# Patient Record
Sex: Female | Born: 1970 | Race: White | Hispanic: No | Marital: Married | Smoking: Never smoker
Health system: Southern US, Community
[De-identification: ages and names within clinical notes are randomized; demographics above are authoritative.]

## PROBLEM LIST (undated history)

## (undated) DIAGNOSIS — N289 Disorder of kidney and ureter, unspecified: Secondary | ICD-10-CM

## (undated) DIAGNOSIS — K279 Peptic ulcer, site unspecified, unspecified as acute or chronic, without hemorrhage or perforation: Secondary | ICD-10-CM

## (undated) DIAGNOSIS — R519 Headache, unspecified: Secondary | ICD-10-CM

## (undated) DIAGNOSIS — K219 Gastro-esophageal reflux disease without esophagitis: Secondary | ICD-10-CM

## (undated) DIAGNOSIS — J329 Chronic sinusitis, unspecified: Secondary | ICD-10-CM

## (undated) DIAGNOSIS — H9319 Tinnitus, unspecified ear: Secondary | ICD-10-CM

## (undated) DIAGNOSIS — R112 Nausea with vomiting, unspecified: Secondary | ICD-10-CM

## (undated) DIAGNOSIS — Z87442 Personal history of urinary calculi: Secondary | ICD-10-CM

## (undated) DIAGNOSIS — Z9889 Other specified postprocedural states: Secondary | ICD-10-CM

## (undated) DIAGNOSIS — N83209 Unspecified ovarian cyst, unspecified side: Secondary | ICD-10-CM

## (undated) DIAGNOSIS — R51 Headache: Secondary | ICD-10-CM

## (undated) DIAGNOSIS — R42 Dizziness and giddiness: Secondary | ICD-10-CM

## (undated) HISTORY — DX: Gastro-esophageal reflux disease without esophagitis: K21.9

## (undated) HISTORY — DX: Dizziness and giddiness: R42

## (undated) HISTORY — PX: KIDNEY STONE SURGERY: SHX686

## (undated) HISTORY — DX: Unspecified ovarian cyst, unspecified side: N83.209

## (undated) HISTORY — PX: APPENDECTOMY: SHX54

## (undated) HISTORY — DX: Tinnitus, unspecified ear: H93.19

---

## 2013-06-29 ENCOUNTER — Emergency Department (HOSPITAL_COMMUNITY): Payer: Self-pay

## 2013-06-29 ENCOUNTER — Encounter (HOSPITAL_COMMUNITY): Payer: Self-pay

## 2013-06-29 ENCOUNTER — Emergency Department (HOSPITAL_COMMUNITY)
Admission: EM | Admit: 2013-06-29 | Discharge: 2013-06-29 | Disposition: A | Discharge status: Home / Self Care | Payer: Self-pay | Attending: Emergency Medicine | Admitting: Emergency Medicine

## 2013-06-29 DIAGNOSIS — Z79899 Other long term (current) drug therapy: Secondary | ICD-10-CM | POA: Insufficient documentation

## 2013-06-29 DIAGNOSIS — Z3202 Encounter for pregnancy test, result negative: Secondary | ICD-10-CM | POA: Insufficient documentation

## 2013-06-29 DIAGNOSIS — N200 Calculus of kidney: Secondary | ICD-10-CM | POA: Insufficient documentation

## 2013-06-29 LAB — URINE MICROSCOPIC-ADD ON

## 2013-06-29 LAB — URINALYSIS, ROUTINE W REFLEX MICROSCOPIC
Nitrite: NEGATIVE
Specific Gravity, Urine: >1.03 — ABNORMAL HIGH (ref 1.005–1.030)
Urobilinogen, UA: 0.2 mg/dL (ref 0.0–1.0)
pH: 5.5 (ref 5.0–8.0)

## 2013-06-29 LAB — POCT PREGNANCY, URINE: Preg Test, Ur: NEGATIVE

## 2013-06-29 MED ORDER — OXYCODONE-ACETAMINOPHEN 5-325 MG PO TABS: 2.0000 | ORAL_TABLET | ORAL | Status: DC | PRN

## 2013-06-29 MED ORDER — ONDANSETRON HCL 4 MG/2ML IJ SOLN
4.0000 mg | Freq: Once | INTRAMUSCULAR | Status: AC
  Administered 2013-06-29: 4 mg via INTRAVENOUS
  Filled 2013-06-29: qty 2

## 2013-06-29 MED ORDER — HYDROMORPHONE HCL PF 1 MG/ML IJ SOLN
INTRAMUSCULAR | Status: AC
  Administered 2013-06-29: 1 mg via INTRAVENOUS
  Filled 2013-06-29: qty 1

## 2013-06-29 MED ORDER — KETOROLAC TROMETHAMINE 30 MG/ML IJ SOLN
INTRAMUSCULAR | Status: AC
  Administered 2013-06-29: 30 mg via INTRAVENOUS
  Filled 2013-06-29: qty 1

## 2013-06-29 MED ORDER — PROMETHAZINE HCL 25 MG PO TABS: 25.0000 mg | ORAL_TABLET | Freq: Four times a day (QID) | ORAL | Status: DC | PRN

## 2013-06-29 NOTE — ED Provider Notes (Signed)
CSN: [id number]     Arrival date & time 06/29/13  7829 History   First MD Initiated Contact with Patient 06/29/13 0340     Chief Complaint  Patient presents with  . Abdominal Pain   (Consider location/radiation/quality/duration/timing/severity/associated sxs/prior Treatment) HPI.... abrupt onset of sharp left flank pain radiating to left lower quadrant approximately 10 PM last night. Urinating in small amounts. No hematuria, fever, chills, dysuria. No previous history of kidney stones. Nothing makes symptoms better or worse. Severity is moderate to severe.  History reviewed. No pertinent past medical history. Past Surgical History  Procedure Laterality Date  . Appendectomy     No family history on file. History  Substance Use Topics  . Smoking status: Never Smoker   . Smokeless tobacco: Not on file  . Alcohol Use: Not on file   OB History   Grav Para Term Preterm Abortions TAB SAB Ect Mult Living                 Review of Systems  All other systems reviewed and are negative.    Allergies  Review of patient's allergies indicates no known allergies.  Home Medications   Current Outpatient Rx  Name  Route  Sig  Dispense  Refill  . cetirizine (ZYRTEC) 10 MG tablet   Oral   Take 10 mg by mouth daily.         Marland Kitchen oxyCODONE-acetaminophen (PERCOCET) 5-325 MG per tablet   Oral   Take 2 tablets by mouth every 4 (four) hours as needed for pain.   6 tablet   0   . oxyCODONE-acetaminophen (PERCOCET) 5-325 MG per tablet   Oral   Take 2 tablets by mouth every 4 (four) hours as needed for pain.   15 tablet   0   . promethazine (PHENERGAN) 25 MG tablet   Oral   Take 1 tablet (25 mg total) by mouth every 6 (six) hours as needed for nausea.   15 tablet   0    BP 145/79  Pulse 99  Temp(Src) 98.4 F (36.9 C)  Resp 24  Ht 5\' 7"  (1.702 m)  Wt 150 lb (68.04 kg)  BMI 23.49 kg/m2  SpO2 98%  LMP 06/06/2013 Physical Exam  Nursing note and vitals reviewed. Constitutional:  She is oriented to person, place, and time. She appears well-developed and well-nourished.  HENT:  Head: Normocephalic and atraumatic.  Eyes: Conjunctivae and EOM are normal. Pupils are equal, round, and reactive to light.  Neck: Normal range of motion. Neck supple.  Cardiovascular: Normal rate, regular rhythm and normal heart sounds.   Pulmonary/Chest: Effort normal and breath sounds normal.  Abdominal: Soft. Bowel sounds are normal.  Genitourinary:  Minimal left flank tenderness radiating to left lower quadrant  Musculoskeletal: Normal range of motion.  Neurological: She is alert and oriented to person, place, and time.  Skin: Skin is warm and dry.  Psychiatric: She has a normal mood and affect.    ED Course  Procedures (including critical care time) Labs Review Labs Reviewed  URINALYSIS, ROUTINE W REFLEX MICROSCOPIC - Abnormal; Notable for the following:    APPearance CLOUDY (*)    Specific Gravity, Urine >1.030 (*)    Hgb urine dipstick LARGE (*)    Bilirubin Urine SMALL (*)    Protein, ur TRACE (*)    Leukocytes, UA SMALL (*)    All other components within normal limits  URINE MICROSCOPIC-ADD ON - Abnormal; Notable for the following:    Squamous  Epithelial / LPF FEW (*)    All other components within normal limits  URINE CULTURE   Imaging Review Ct Abdomen Pelvis Wo Contrast  06/29/2013   *RADIOLOGY REPORT*  Clinical Data: Left flank and left lower quadrant pain since 10:00 p.m.  CT ABDOMEN AND PELVIS WITHOUT CONTRAST  Technique:  Multidetector CT imaging of the abdomen and pelvis was performed following the standard protocol without intravenous contrast.  Comparison: None.  Findings: Mild dependent changes in the lung bases.  Small esophageal hiatal hernia.  Multiple intrarenal stones in both kidneys.  There is a 3 mm stone in the distal left ureter at the ureterovesicle junction with moderate proximal ureterectasis and pyelocaliectasis.  No significant pararenal stranding.   No ureteral stone or dilatation on the right.  The bladder is decompressed.  The unenhanced appearance of the liver, spleen, gallbladder, pancreas, adrenal glands, abdominal aorta, inferior vena cava, and retroperitoneal lymph nodes is unremarkable.  The stomach and small bowel are decompressed.  Stool filled colon without distension.  No free air or free fluid in the abdomen.  Pelvis:  Uterus and ovaries are not significantly enlarged.  Low attenuation in the left ovary likely represents a functional cyst. No free or loculated pelvic fluid collections.  The appendix is normal.  No evidence of diverticulitis.  No significant pelvic lymphadenopathy.  Normal alignment of the lumbar spine.  IMPRESSION: 3 mm stone in the distal left ureter with moderate proximal obstruction.  Nonobstructing bilateral intrarenal stones.   Original Report Authenticated By: Burman Nieves, M.D.    MDM   1. Kidney stone on left side    History and physical consistent with kidney stone. CT scan reveals 3 mm distal left ureteral stone.  Patient feels better after pain management.   Discharge meds Percocet and Phenergan 25 mg    Donnetta Hutching, MD 06/29/13 [phone]

## 2013-06-30 LAB — URINE CULTURE

## 2013-07-16 MED FILL — Oxycodone w/ Acetaminophen Tab 5-325 MG: ORAL | Qty: 6 | Status: AC

## 2015-04-11 ENCOUNTER — Other Ambulatory Visit (HOSPITAL_COMMUNITY): Payer: Self-pay | Admitting: Internal Medicine

## 2015-04-11 DIAGNOSIS — Z1231 Encounter for screening mammogram for malignant neoplasm of breast: Secondary | ICD-10-CM

## 2015-04-19 ENCOUNTER — Ambulatory Visit (HOSPITAL_COMMUNITY): Payer: Self-pay

## 2015-10-22 HISTORY — PX: OTHER SURGICAL HISTORY: SHX169

## 2015-12-21 ENCOUNTER — Encounter (HOSPITAL_COMMUNITY): Payer: Self-pay | Admitting: *Deleted

## 2015-12-21 ENCOUNTER — Emergency Department (HOSPITAL_COMMUNITY): Payer: BLUE CROSS/BLUE SHIELD

## 2015-12-21 ENCOUNTER — Emergency Department (HOSPITAL_COMMUNITY)
Admission: EM | Admit: 2015-12-21 | Discharge: 2015-12-21 | Disposition: A | Discharge status: Home / Self Care | Payer: BLUE CROSS/BLUE SHIELD | Attending: Emergency Medicine | Admitting: Emergency Medicine

## 2015-12-21 DIAGNOSIS — R69 Illness, unspecified: Secondary | ICD-10-CM

## 2015-12-21 DIAGNOSIS — R112 Nausea with vomiting, unspecified: Secondary | ICD-10-CM | POA: Diagnosis not present

## 2015-12-21 DIAGNOSIS — Z79899 Other long term (current) drug therapy: Secondary | ICD-10-CM | POA: Diagnosis not present

## 2015-12-21 DIAGNOSIS — Z8711 Personal history of peptic ulcer disease: Secondary | ICD-10-CM | POA: Insufficient documentation

## 2015-12-21 DIAGNOSIS — R0789 Other chest pain: Secondary | ICD-10-CM | POA: Insufficient documentation

## 2015-12-21 DIAGNOSIS — J111 Influenza due to unidentified influenza virus with other respiratory manifestations: Secondary | ICD-10-CM | POA: Diagnosis not present

## 2015-12-21 DIAGNOSIS — R079 Chest pain, unspecified: Secondary | ICD-10-CM | POA: Diagnosis present

## 2015-12-21 HISTORY — DX: Chronic sinusitis, unspecified: J32.9

## 2015-12-21 HISTORY — DX: Peptic ulcer, site unspecified, unspecified as acute or chronic, without hemorrhage or perforation: K27.9

## 2015-12-21 LAB — BASIC METABOLIC PANEL
Anion gap: 8 (ref 5–15)
BUN: 12 mg/dL (ref 6–20)
CHLORIDE: 108 mmol/L (ref 101–111)
CO2: 26 mmol/L (ref 22–32)
CREATININE: 0.66 mg/dL (ref 0.44–1.00)
Calcium: 8.5 mg/dL — ABNORMAL LOW (ref 8.9–10.3)
GFR calc non Af Amer: >60 mL/min (ref >60)
GLUCOSE: 100 mg/dL — AB (ref 65–99)
Potassium: 3.8 mmol/L (ref 3.5–5.1)
Sodium: 142 mmol/L (ref 135–145)

## 2015-12-21 LAB — CBC WITH DIFFERENTIAL/PLATELET
Basophils Absolute: 0 10*3/uL (ref 0.0–0.1)
Basophils Relative: 1 %
Eosinophils Absolute: 0.1 10*3/uL (ref 0.0–0.7)
Eosinophils Relative: 1 %
HEMATOCRIT: 40.5 % (ref 36.0–46.0)
HEMOGLOBIN: 14 g/dL (ref 12.0–15.0)
LYMPHS ABS: 1.1 10*3/uL (ref 0.7–4.0)
LYMPHS PCT: 24 %
MCH: 31.3 pg (ref 26.0–34.0)
MCHC: 34.6 g/dL (ref 30.0–36.0)
MCV: 90.6 fL (ref 78.0–100.0)
MONOS PCT: 6 %
Monocytes Absolute: 0.3 10*3/uL (ref 0.1–1.0)
NEUTROS ABS: 3.1 10*3/uL (ref 1.7–7.7)
NEUTROS PCT: 68 %
Platelets: 288 10*3/uL (ref 150–400)
RBC: 4.47 MIL/uL (ref 3.87–5.11)
RDW: 12 % (ref 11.5–15.5)
WBC: 4.5 10*3/uL (ref 4.0–10.5)

## 2015-12-21 LAB — I-STAT TROPONIN, ED
Troponin i, poc: 0 ng/mL (ref 0.00–0.08)
Troponin i, poc: 0 ng/mL (ref 0.00–0.08)

## 2015-12-21 MED ORDER — ALBUTEROL SULFATE (2.5 MG/3ML) 0.083% IN NEBU
5.0000 mg | INHALATION_SOLUTION | Freq: Once | RESPIRATORY_TRACT | Status: AC
  Administered 2015-12-21: 5 mg via RESPIRATORY_TRACT
  Filled 2015-12-21: qty 6

## 2015-12-21 MED ORDER — PROMETHAZINE HCL 25 MG/ML IJ SOLN
25.0000 mg | Freq: Once | INTRAMUSCULAR | Status: AC
  Administered 2015-12-21: 25 mg via INTRAMUSCULAR
  Filled 2015-12-21: qty 1

## 2015-12-21 MED ORDER — OXYCODONE-ACETAMINOPHEN 5-325 MG PO TABS: 1.0000 | ORAL_TABLET | ORAL | Status: DC | PRN

## 2015-12-21 MED ORDER — METOCLOPRAMIDE HCL 5 MG/ML IJ SOLN
10.0000 mg | Freq: Once | INTRAMUSCULAR | Status: DC
  Filled 2015-12-21: qty 2

## 2015-12-21 MED ORDER — OXYCODONE-ACETAMINOPHEN 5-325 MG PO TABS
2.0000 | ORAL_TABLET | Freq: Once | ORAL | Status: AC
  Administered 2015-12-21: 2 via ORAL
  Filled 2015-12-21: qty 2

## 2015-12-21 MED ORDER — PROMETHAZINE HCL 25 MG RE SUPP: 25.0000 mg | Freq: Four times a day (QID) | RECTAL | Status: DC | PRN

## 2015-12-21 MED ORDER — PANTOPRAZOLE SODIUM 40 MG PO TBEC
40.0000 mg | DELAYED_RELEASE_TABLET | Freq: Once | ORAL | Status: AC
  Administered 2015-12-21: 40 mg via ORAL
  Filled 2015-12-21: qty 1

## 2015-12-21 MED ORDER — GI COCKTAIL ~~LOC~~
30.0000 mL | Freq: Once | ORAL | Status: AC
  Administered 2015-12-21: 30 mL via ORAL
  Filled 2015-12-21: qty 30

## 2015-12-21 MED ORDER — METOCLOPRAMIDE HCL 10 MG PO TABS
10.0000 mg | ORAL_TABLET | Freq: Once | ORAL | Status: AC
  Administered 2015-12-21: 10 mg via ORAL
  Filled 2015-12-21: qty 1

## 2015-12-21 MED ORDER — MORPHINE SULFATE (PF) 4 MG/ML IV SOLN
4.0000 mg | Freq: Once | INTRAVENOUS | Status: AC
  Administered 2015-12-21: 4 mg via INTRAMUSCULAR
  Filled 2015-12-21: qty 1

## 2015-12-21 NOTE — ED Provider Notes (Signed)
CSN: [id number]     Arrival date & time 12/21/15  4098 History   First MD Initiated Contact with Patient 12/21/15 [phone]     Chief Complaint  Patient presents with  . Chest Pain     (Consider location/radiation/quality/duration/timing/severity/associated sxs/prior Treatment) HPI this 45 year old female who presents today from her primary care office with multiple complaints. She states that she initially began having some influenza-like symptoms 2 days ago. She noted some nasal congestion, cough, chills and had a fever at home. She has also had some nausea and vomiting associated with this. She states that yesterday she had 2 episodes where the emesis had blood in it. She has a history of peptic ulcer disease and has had some bleeding with that in the past. The vomiting has been somewhat ongoing although it worsened during her illness. She has not noted any ongoing hematemesis, coffee-ground emesis, any blood in her stool. She has not been lightheaded. She has also begun having some pain in the left anterior side of her chest that she describes as pressure and sharp. Is worse with movement and coughing. Pain is intermittent. It lasts up to 15-30 minutes and she took aspirin last night for. She is currently not the pain except with palpation or movement or coughing.  Past Medical History  Diagnosis Date  . Sinusitis   . Peptic ulcer    Past Surgical History  Procedure Laterality Date  . Appendectomy     No family history on file. Social History  Substance Use Topics  . Smoking status: Never Smoker   . Smokeless tobacco: None  . Alcohol Use: Yes     Comment: Occ   OB History    No data available     Review of Systems  All other systems reviewed and are negative.     Allergies  Review of patient's allergies indicates no known allergies.  Home Medications   Prior to Admission medications   Medication Sig Start Date End Date Taking? Authorizing Provider  cetirizine (ZYRTEC) 10 MG  tablet Take 10 mg by mouth daily.    Historical Provider, MD  oxyCODONE-acetaminophen (PERCOCET) 5-325 MG per tablet Take 2 tablets by mouth every 4 (four) hours as needed for pain. 06/29/13   Donnetta Hutching, MD  oxyCODONE-acetaminophen (PERCOCET) 5-325 MG per tablet Take 2 tablets by mouth every 4 (four) hours as needed for pain. 06/29/13   Donnetta Hutching, MD  promethazine (PHENERGAN) 25 MG tablet Take 1 tablet (25 mg total) by mouth every 6 (six) hours as needed for nausea. 06/29/13   Donnetta Hutching, MD   BP 156/95 mmHg  Pulse 83  Temp(Src) 97.9 F (36.6 C) (Oral)  Resp 14  Ht  (1.702 m)  Wt 63.504 kg  BMI 21.92 kg/m2  SpO2 100%  LMP 12/17/2015 Physical Exam  Constitutional: She is oriented to person, place, and time. She appears well-developed and well-nourished. No distress.  HENT:  Head: Normocephalic and atraumatic.  Right Ear: External ear normal.  Left Ear: External ear normal.  Nose: Nose normal.  Eyes: Conjunctivae and EOM are normal. Pupils are equal, round, and reactive to light.  Neck: Normal range of motion. Neck supple.  Cardiovascular: Normal rate, regular rhythm, normal heart sounds and intact distal pulses.   Pulmonary/Chest: Effort normal and breath sounds normal. She exhibits tenderness.    Musculoskeletal: Normal range of motion. She exhibits no edema or tenderness.  Neurological: She is alert and oriented to person, place, and time. She exhibits normal  muscle tone. Coordination normal.  Skin: Skin is warm and dry.  Psychiatric: She has a normal mood and affect. Her behavior is normal. Judgment and thought content normal.  Nursing note and vitals reviewed.   ED Course  Procedures (including critical care time) Labs Review Labs Reviewed  BASIC METABOLIC PANEL - Abnormal; Notable for the following:    Glucose, Bld 100 (*)    Calcium 8.5 (*)    All other components within normal limits  CBC WITH DIFFERENTIAL/PLATELET  Rosezena Sensor, ED  POC OCCULT BLOOD, ED    Rosezena Sensor, ED    Imaging Review Dg Chest 2 View  12/21/2015  CLINICAL DATA:  Chest pain for several weeks. EXAM: CHEST  2 VIEW COMPARISON:  None. FINDINGS: The heart size and mediastinal contours are within normal limits. Both lungs are clear. No pneumothorax or pleural effusion is noted. The visualized skeletal structures are unremarkable. IMPRESSION: No active cardiopulmonary disease. Electronically Signed   By: Lupita Raider, M.D.   On: 12/21/2015 08:46   US Abdomen Complete  12/21/2015  CLINICAL DATA:  Acute epigastric abdominal pain. EXAM: ABDOMEN ULTRASOUND COMPLETE COMPARISON:  CT scan of June 29, 2013. FINDINGS: Gallbladder: No gallstones or wall thickening visualized. No sonographic Murphy sign noted by sonographer. Common bile duct: Diameter: 4.1 mm which is within normal limits. Liver: No focal lesion identified. Within normal limits in parenchymal echogenicity. IVC: No abnormality visualized. Pancreas: Visualized portion unremarkable. Spleen: Size and appearance within normal limits. Right Kidney: Length: 10.6 cm. Echogenicity within normal limits. No mass or hydronephrosis visualized. Left Kidney: Length: 11.3 cm. Echogenicity within normal limits. No mass or hydronephrosis visualized. Abdominal aorta: No aneurysm visualized. Other findings: None. IMPRESSION: No definite abnormality seen in the abdomen. Electronically Signed   By: Lupita Raider, M.D.   On: 12/21/2015 12:29   I have personally reviewed and evaluated these images and lab results as part of my medical decision-making.   EKG Interpretation   Date/Time:  Thursday December 21 2015 07:38:45 EST Ventricular Rate:  77 PR Interval:  162 QRS Duration: 82 QT Interval:  395 QTC Calculation: 447 R Axis:   64 Text Interpretation:  Sinus rhythm Baseline wander in lead(s) I III aVL  aVF Poor data quality, interpretation may be adversely affected  Confirmed by Tayt Moyers MD, Clayborne Divis [phone]) on 12/21/2015 8:30:03 AM       MDM   Final diagnoses:  Influenza-like illness  Chest pain, unspecified chest pain type  Non-intractable vomiting with nausea, vomiting of unspecified type    Patient presents today with upper respiratory infection symptoms, cough, nausea and vomiting. She also had chest wall pain. She was evaluated here for cardiac etiology with normal EKG and serial troponins which were normal. She continued to have burning in the epigastric region with nausea and vomiting. Secondary to this she receives several medications eventually with some improvement after Phenergan and morphine. She also had an ultrasound of her abdomen showed no definite acute abnormality with a normal gallbladder. She is discharged to home with Phenergan and 6 Percocet. She's advised regarding return precautions specifically vomiting blood again, inability to keep down medicines, high fever, or change in her pain. She voices understanding of return precautions and follow-up.   Margarita Grizzle, MD 12/21/15 1324

## 2015-12-21 NOTE — ED Notes (Signed)
Occult Blood - negative.

## 2015-12-21 NOTE — ED Notes (Signed)
Pt. D/c papers/prescription given and reviewed. Patient verbalized understanding.

## 2015-12-21 NOTE — Discharge Instructions (Signed)
Nausea and Vomiting  Nausea means you feel sick to your stomach. Throwing up (vomiting) is a reflex where stomach contents come out of your mouth.  HOME CARE   · Take medicine as told by your doctor.  · Do not force yourself to eat. However, you do need to drink fluids.  · If you feel like eating, eat a normal diet as told by your doctor.    Eat rice, wheat, potatoes, bread, lean meats, yogurt, fruits, and vegetables.    Avoid high-fat foods.  · Drink enough fluids to keep your pee (urine) clear or pale yellow.  · Ask your doctor how to replace body fluid losses (rehydrate). Signs of body fluid loss (dehydration) include:    Feeling very thirsty.    Dry lips and mouth.    Feeling dizzy.    Dark pee.    Peeing less than normal.    Feeling confused.    Fast breathing or heart rate.  GET HELP RIGHT AWAY IF:   · You have blood in your throw up.  · You have black or bloody poop (stool).  · You have a bad headache or stiff neck.  · You feel confused.  · You have bad belly (abdominal) pain.  · You have chest pain or trouble breathing.  · You do not pee at least once every 8 hours.  · You have cold, clammy skin.  · You keep throwing up after 24 to 48 hours.  · You have a fever.  MAKE SURE YOU:   · Understand these instructions.  · Will watch your condition.  · Will get help right away if you are not doing well or get worse.     This information is not intended to replace advice given to you by your health care provider. Make sure you discuss any questions you have with your health care provider.     Document Released: 03/25/2008 Document Revised: 12/30/2011 Document Reviewed: 03/08/2011  Elsevier Interactive Patient Education ©2016 [employer]

## 2015-12-21 NOTE — ED Notes (Addendum)
Pt states she was sent her by PCP this morning. Pt states intermittent left-sided CP, described as sharp. Pain is worse with movement. Pt states chronic sinusitis with worsening dry cough since last night. Pain first noticed a week ago "off and on". Pt states she has felt a little sob this morning. Continuous dry cough during triage.

## 2015-12-21 NOTE — ED Notes (Addendum)
Pt vomited x 1. Moderate amount.

## 2015-12-21 NOTE — ED Notes (Signed)
RT called

## 2016-01-22 DIAGNOSIS — N200 Calculus of kidney: Secondary | ICD-10-CM | POA: Diagnosis not present

## 2016-01-22 DIAGNOSIS — R3915 Urgency of urination: Secondary | ICD-10-CM | POA: Diagnosis not present

## 2016-01-22 DIAGNOSIS — R351 Nocturia: Secondary | ICD-10-CM | POA: Diagnosis not present

## 2016-01-23 DIAGNOSIS — R200 Anesthesia of skin: Secondary | ICD-10-CM | POA: Diagnosis not present

## 2016-01-23 DIAGNOSIS — R079 Chest pain, unspecified: Secondary | ICD-10-CM | POA: Diagnosis not present

## 2016-02-01 DIAGNOSIS — R079 Chest pain, unspecified: Secondary | ICD-10-CM | POA: Diagnosis not present

## 2016-02-01 DIAGNOSIS — R200 Anesthesia of skin: Secondary | ICD-10-CM | POA: Diagnosis not present

## 2016-02-13 DIAGNOSIS — R3915 Urgency of urination: Secondary | ICD-10-CM | POA: Diagnosis not present

## 2016-02-13 DIAGNOSIS — R351 Nocturia: Secondary | ICD-10-CM | POA: Diagnosis not present

## 2016-02-14 DIAGNOSIS — R3915 Urgency of urination: Secondary | ICD-10-CM | POA: Diagnosis not present

## 2016-02-14 DIAGNOSIS — R351 Nocturia: Secondary | ICD-10-CM | POA: Diagnosis not present

## 2016-03-06 ENCOUNTER — Encounter: Payer: Self-pay | Admitting: Gastroenterology

## 2016-03-12 DIAGNOSIS — R3915 Urgency of urination: Secondary | ICD-10-CM | POA: Diagnosis not present

## 2016-03-14 ENCOUNTER — Encounter (INDEPENDENT_AMBULATORY_CARE_PROVIDER_SITE_OTHER): Payer: Self-pay | Admitting: *Deleted

## 2016-04-04 ENCOUNTER — Ambulatory Visit: Payer: BLUE CROSS/BLUE SHIELD | Admitting: Gastroenterology

## 2016-04-15 ENCOUNTER — Other Ambulatory Visit (INDEPENDENT_AMBULATORY_CARE_PROVIDER_SITE_OTHER): Payer: Self-pay | Admitting: Internal Medicine

## 2016-04-15 ENCOUNTER — Encounter (INDEPENDENT_AMBULATORY_CARE_PROVIDER_SITE_OTHER): Payer: Self-pay | Admitting: Internal Medicine

## 2016-04-15 ENCOUNTER — Ambulatory Visit (INDEPENDENT_AMBULATORY_CARE_PROVIDER_SITE_OTHER): Payer: BLUE CROSS/BLUE SHIELD | Admitting: Internal Medicine

## 2016-04-15 ENCOUNTER — Encounter (INDEPENDENT_AMBULATORY_CARE_PROVIDER_SITE_OTHER): Payer: Self-pay | Admitting: *Deleted

## 2016-04-15 VITALS — BP 114/80 | HR 72 | Temp 98.1°F | Ht 67.0 in | Wt 158.7 lb

## 2016-04-15 DIAGNOSIS — K219 Gastro-esophageal reflux disease without esophagitis: Secondary | ICD-10-CM

## 2016-04-15 DIAGNOSIS — K589 Irritable bowel syndrome without diarrhea: Secondary | ICD-10-CM

## 2016-04-15 DIAGNOSIS — K921 Melena: Secondary | ICD-10-CM | POA: Diagnosis not present

## 2016-04-15 HISTORY — DX: Gastro-esophageal reflux disease without esophagitis: K21.9

## 2016-04-15 MED ORDER — PANTOPRAZOLE SODIUM 40 MG PO TBEC: 40.0000 mg | DELAYED_RELEASE_TABLET | Freq: Every day | ORAL | Status: DC

## 2016-04-15 MED ORDER — DICYCLOMINE HCL 10 MG PO CAPS: 10.0000 mg | ORAL_CAPSULE | Freq: Three times a day (TID) | ORAL | Status: DC

## 2016-04-15 NOTE — Progress Notes (Signed)
   Subjective:    Patient ID: Kara Fowler, female    DOB: 05/04/1971, 45 y.o.   MRN: [id number] PCP: Dr.Fusco.  HPI Referred by Dr. Sherwood GamblerFusco for GERD.  She has nausea and vomiting. She occasionally see blood when she vomits. She see 1-2 times a week, Bright red in color.  She does have bloating. The vomiting is on a daily basis. She also has diarrhea daily. She has a BM x 3 a day. She says it is always diarrhea. She has had diarrhea for at least a year or longer. No blood in her diarrhea. When she eats, it is like water going thru her. No dysphagia.  She does occasionally have a formed stool.  No melena or BRRB.  No recent antibiotics Appetite is good. She has lost about 10 pounds over the past 6 months. She was seen in the ED in March and Cardiac was ruled out.  She was follow up with Cardiology in [place]. Stress was normal.  No family hx of colon cancer. Grandmother had bladder cancer. No NSAIDS.   12/21/2015 US abdomen: epigastric pain Impression: normal.   Review of Systems Past Medical History  Diagnosis Date  . Sinusitis   . Peptic ulcer   . GERD (gastroesophageal reflux disease) 04/15/2016    Past Surgical History  Procedure Laterality Date  . Appendectomy      No Known Allergies  Current Outpatient Prescriptions on File Prior to Visit  Medication Sig Dispense Refill  . cetirizine (ZYRTEC) 10 MG tablet Take 10 mg by mouth daily.    . Multiple Vitamin (MULTIVITAMIN WITH MINERALS) TABS tablet Take 1 tablet by mouth daily.    Marland Kitchen. omeprazole (PRILOSEC OTC) 20 MG tablet Take 20 mg by mouth daily as needed (acid reflux). Reported on 04/15/2016    . promethazine (PHENERGAN) 25 MG suppository Place 1 suppository (25 mg total) rectally every 6 (six) hours as needed for nausea or vomiting. 12 each 0   No current facility-administered medications on file prior to visit.        Objective:   Physical Exam Blood pressure 114/80, pulse 72, temperature 98.1 F (36.7 C), height 5\' 7"  (1.702  m), weight 158 lb 11.2 oz (71.986 kg).]  Alert and oriented. Skin warm and dry. Oral mucosa is moist.   . Sclera anicteric, conjunctivae is pink. Thyroid not enlarged. No cervical lymphadenopathy. Lungs clear. Heart regular rate and rhythm.  Abdomen is soft. Bowel sounds are positive. No hepatomegaly. No abdominal masses felt. No tenderness.  No edema to lower extremities.         Assessment & Plan:  GERD not controlled at this time. PUD needs to be ruled out. Rx for Protonix sent to her pharmacy.  Possible IBS: Am going to start her on Dicyclomine 10mg  TID and see how she does.  EGD. The risks and benefits such as perforation, bleeding, and infection were reviewed with the patient and is agreeable.

## 2016-04-15 NOTE — Patient Instructions (Signed)
Rx for Protonix 40mg  daily. Rx for Dicyclomine 10mg  TID. The risks and benefits such as perforation, bleeding, and infection were reviewed with the patient and is agreeable.

## 2016-04-16 DIAGNOSIS — Z6825 Body mass index (BMI) 25.0-25.9, adult: Secondary | ICD-10-CM | POA: Diagnosis not present

## 2016-04-16 DIAGNOSIS — Z01419 Encounter for gynecological examination (general) (routine) without abnormal findings: Secondary | ICD-10-CM | POA: Diagnosis not present

## 2016-04-16 DIAGNOSIS — R3915 Urgency of urination: Secondary | ICD-10-CM | POA: Diagnosis not present

## 2016-04-16 DIAGNOSIS — R351 Nocturia: Secondary | ICD-10-CM | POA: Diagnosis not present

## 2016-05-17 ENCOUNTER — Ambulatory Visit (HOSPITAL_COMMUNITY)
Admission: RE | Admit: 2016-05-17 | Discharge: 2016-05-17 | Disposition: A | Discharge status: Home Health | Payer: BLUE CROSS/BLUE SHIELD | Source: Ambulatory Visit | Attending: Internal Medicine | Admitting: Internal Medicine

## 2016-05-17 ENCOUNTER — Encounter (HOSPITAL_COMMUNITY): Payer: Self-pay | Admitting: *Deleted

## 2016-05-17 ENCOUNTER — Encounter (HOSPITAL_COMMUNITY)
Admission: RE | Disposition: A | Discharge status: Home Health | Payer: Self-pay | Source: Ambulatory Visit | Attending: Internal Medicine

## 2016-05-17 ENCOUNTER — Telehealth: Payer: Self-pay | Admitting: Gastroenterology

## 2016-05-17 DIAGNOSIS — K449 Diaphragmatic hernia without obstruction or gangrene: Secondary | ICD-10-CM | POA: Insufficient documentation

## 2016-05-17 DIAGNOSIS — R112 Nausea with vomiting, unspecified: Secondary | ICD-10-CM | POA: Diagnosis not present

## 2016-05-17 DIAGNOSIS — K317 Polyp of stomach and duodenum: Secondary | ICD-10-CM | POA: Diagnosis not present

## 2016-05-17 DIAGNOSIS — Z8711 Personal history of peptic ulcer disease: Secondary | ICD-10-CM | POA: Insufficient documentation

## 2016-05-17 DIAGNOSIS — K2950 Unspecified chronic gastritis without bleeding: Secondary | ICD-10-CM | POA: Diagnosis not present

## 2016-05-17 DIAGNOSIS — K219 Gastro-esophageal reflux disease without esophagitis: Secondary | ICD-10-CM | POA: Insufficient documentation

## 2016-05-17 DIAGNOSIS — R10816 Epigastric abdominal tenderness: Secondary | ICD-10-CM | POA: Diagnosis not present

## 2016-05-17 DIAGNOSIS — Z79899 Other long term (current) drug therapy: Secondary | ICD-10-CM | POA: Insufficient documentation

## 2016-05-17 DIAGNOSIS — K921 Melena: Secondary | ICD-10-CM

## 2016-05-17 HISTORY — PX: BIOPSY: SHX5522

## 2016-05-17 HISTORY — DX: Other specified postprocedural states: Z98.890

## 2016-05-17 HISTORY — DX: Other specified postprocedural states: R11.2

## 2016-05-17 HISTORY — PX: ESOPHAGOGASTRODUODENOSCOPY: SHX5428

## 2016-05-17 SURGERY — EGD (ESOPHAGOGASTRODUODENOSCOPY)
Anesthesia: Moderate Sedation

## 2016-05-17 MED ORDER — MIDAZOLAM HCL 5 MG/5ML IJ SOLN
INTRAMUSCULAR | Status: AC
  Filled 2016-05-17: qty 10

## 2016-05-17 MED ORDER — BUTAMBEN-TETRACAINE-BENZOCAINE 2-2-14 % EX AERO
INHALATION_SPRAY | CUTANEOUS | Status: DC | PRN
  Administered 2016-05-17: 2 via TOPICAL

## 2016-05-17 MED ORDER — SODIUM CHLORIDE 0.9 % IV SOLN
INTRAVENOUS | Status: DC
  Administered 2016-05-17: 1000 mL via INTRAVENOUS

## 2016-05-17 MED ORDER — METOCLOPRAMIDE HCL 10 MG PO TABS: 10.0000 mg | ORAL_TABLET | Freq: Three times a day (TID) | ORAL | 0 refills | Status: DC

## 2016-05-17 MED ORDER — MEPERIDINE HCL 50 MG/ML IJ SOLN
INTRAMUSCULAR | Status: DC
Start: 2016-05-17 — End: 2016-05-17
  Filled 2016-05-17: qty 1

## 2016-05-17 MED ORDER — MIDAZOLAM HCL 5 MG/5ML IJ SOLN
INTRAMUSCULAR | Status: DC | PRN
  Administered 2016-05-17: 1 mg via INTRAVENOUS
  Administered 2016-05-17 (×2): 2 mg via INTRAVENOUS
  Administered 2016-05-17: 3 mg via INTRAVENOUS

## 2016-05-17 MED ORDER — PANTOPRAZOLE SODIUM 40 MG PO TBEC: 40.0000 mg | DELAYED_RELEASE_TABLET | Freq: Two times a day (BID) | ORAL | 5 refills | Status: DC

## 2016-05-17 MED ORDER — MEPERIDINE HCL 50 MG/ML IJ SOLN
INTRAMUSCULAR | Status: DC | PRN
  Administered 2016-05-17 (×2): 25 mg via INTRAVENOUS

## 2016-05-17 MED ORDER — PROMETHAZINE HCL 12.5 MG PO TABS: ORAL_TABLET | ORAL | 0 refills | Status: DC

## 2016-05-17 MED ORDER — STERILE WATER FOR IRRIGATION IR SOLN
Status: DC | PRN
  Administered 2016-05-17: 11:00:00

## 2016-05-17 NOTE — Discharge Instructions (Signed)
Discontinue dicyclomine and omeprazole Increase pantoprazole to 40 mg by mouth 30 minutes before breakfast and evening meal daily. Metoclopramide 10 mg by mouth 30 minutes before each meal. If this medication makes her nervous or you have side effects he can stop this medication and call office. Resume usual diet. No driving for 24 hours. Keep symptom diary for the next 2-4 weeks as to frequency of nausea vomiting diarrhea and mealtime. Physician will call with biopsy results.   Gastrointestinal Endoscopy, Care After Refer to this sheet in the next few weeks. These instructions provide you with information on caring for yourself after your procedure. Your caregiver may also give you more specific instructions. Your treatment has been planned according to current medical practices, but problems sometimes occur. Call your caregiver if you have any problems or questions after your procedure. HOME CARE INSTRUCTIONS  If you were given medicine to help you relax (sedative), do not drive, operate machinery, or sign important documents for 24 hours.  Avoid alcohol and hot or warm beverages for the first 24 hours after the procedure.  Only take over-the-counter or prescription medicines for pain, discomfort, or fever as directed by your caregiver. You may resume taking your normal medicines unless your caregiver tells you otherwise. Ask your caregiver when you may resume taking medicines that may cause bleeding, such as aspirin, clopidogrel, or warfarin.  You may return to your normal diet and activities on the day after your procedure, or as directed by your caregiver. Walking may help to reduce any bloated feeling in your abdomen.  Drink enough fluids to keep your urine clear or pale yellow.  You may gargle with salt water if you have a sore throat. SEEK IMMEDIATE MEDICAL CARE IF:  You have severe nausea or vomiting.  You have severe abdominal pain, abdominal cramps that last longer than 6  hours, or abdominal swelling (distention).  You have severe shoulder or back pain.  You have trouble swallowing.  You have shortness of breath, your breathing is shallow, or you are breathing faster than normal.  You have a fever or a rapid heartbeat.  You vomit blood or material that looks like coffee grounds.  You have bloody, black, or tarry stools. MAKE SURE YOU:  Understand these instructions.  Will watch your condition.  Will get help right away if you are not doing well or get worse.   This information is not intended to replace advice given to you by your health care provider. Make sure you discuss any questions you have with your health care provider.

## 2016-05-17 NOTE — Op Note (Signed)
[facility] Patient Name: Kara Fowler Procedure Date: 05/17/2016 10:58 AM MRN: [id number] Date of Birth: June 22, 1971 Attending MD: Lionel December , MD CSN: [id number] Age: 45 Admit Type: Outpatient Procedure:                Upper GI endoscopy Indications:              Suspected reflux esophagitis, Nausea with vomiting Providers:                Lionel December, MD, Loma Messing B. Patsy Lager, RN, Marlene Lard, Technician Referring MD:             Elfredia Nevins, MD Medicines:                Cetacaine spray, Meperidine 50 mg IV, Midazolam 8                            mg IV Complications:            No immediate complications. Estimated Blood Loss:     Estimated blood loss was minimal. Procedure:                Pre-Anesthesia Assessment:                           - Prior to the procedure, a History and Physical                            was performed, and patient medications and                            allergies were reviewed. The patient's tolerance of                            previous anesthesia was also reviewed. The risks                            and benefits of the procedure and the sedation                            options and risks were discussed with the patient.                            All questions were answered, and informed consent                            was obtained. Prior Anticoagulants: The patient has                            taken no previous anticoagulant or antiplatelet                            agents. ASA Grade Assessment: I - A normal, healthy  patient. After reviewing the risks and benefits,                            the patient was deemed in satisfactory condition to                            undergo the procedure.                           After obtaining informed consent, the endoscope was                            passed under direct vision. Throughout the   procedure, the patient's blood pressure, pulse, and                            oxygen saturations were monitored continuously. The                            Endoscope was introduced through the mouth, and                            advanced to the second part of duodenum. The upper                            GI endoscopy was accomplished without difficulty.                            The patient tolerated the procedure well. Scope In: 11:18:54 AM Scope Out: 11:28:40 AM Total Procedure Duration: 0 hours 9 minutes 46 seconds  Findings:      The examined esophagus was normal.      The Z-line was regular and was found 37 cm from the incisors.      A 2 cm hiatal hernia was present.      Multiple 3 to 5 mm sessile polyps with no bleeding and no stigmata of       recent bleeding were found in the gastric fundus and in the gastric       body. Biopsies were taken with a cold forceps for histology. The       pathology specimen was placed into Bottle Number 1.      The cardia, gastric antrum, prepyloric region of the stomach and pylorus       were normal. Biopsies were taken from antral mucosa with a cold forceps       for histology. The pathology specimen was placed into Bottle Number 2.      The duodenal bulb and second portion of the duodenum were normal.       Biopsies for histology were taken with a cold forceps for evaluation of       celiac disease. The pathology specimen was placed into Bottle Number 3. Impression:               - Normal esophagus.                           - Z-line regular, 37 cm from the incisors.                           -  2 cm hiatal hernia.                           - Multiple gastric polyps. Biopsied.                           - Normal cardia, antrum, prepyloric region of the                            stomach and pylorus. Ant5ral mucosa biopsied.                           - Normal duodenal bulb and second portion of the                            duodenum.  Biopsied. Moderate Sedation:      Moderate (conscious) sedation was administered by the endoscopy nurse       and supervised by the endoscopist. The following parameters were       monitored: oxygen saturation, heart rate, blood pressure, CO2       capnography and response to care. Total physician intraservice time was       16 minutes. Recommendation:           - Patient has a contact number available for                            emergencies. The signs and symptoms of potential                            delayed complications were discussed with the                            patient. Return to normal activities tomorrow.                            Written discharge instructions were provided to the                            patient.                           - Resume previous diet today.                           - Use Protonix (pantoprazole) 40 mg PO BID.                           - Use metoclopramide (Reglan) 10 mg PO AC.                           - Await pathology results.                           - Continue present medications. Procedure Code(s):        --- Professional ---  37482, Esophagogastroduodenoscopy, flexible,                            transoral; with biopsy, single or multiple                           99152, Moderate sedation services provided by the                            same physician or other qualified health care                            professional performing the diagnostic or                            therapeutic service that the sedation supports,                            requiring the presence of an independent trained                            observer to assist in the monitoring of the                            patient's level of consciousness and physiological                            status; initial 15 minutes of intraservice time,                            patient age 87 years or older Diagnosis Code(s):         --- Professional ---                           K44.9, Diaphragmatic hernia without obstruction or                            gangrene                           K31.7, Polyp of stomach and duodenum                           R11.2, Nausea with vomiting, unspecified CPT copyright 2016 [employer]. All rights reserved. The codes documented in this report are preliminary and upon coder review may  be revised to meet current compliance requirements. Lionel December, MD Lionel December, MD 05/17/2016 11:42:22 AM This report has been signed electronically. Number of Addenda: 0

## 2016-05-17 NOTE — H&P (Signed)
Kara Fowler is an 45 y.o. female.   Chief Complaint: Patient is here for EGD. HPI: Patient is 45 year old Caucasian female who presents with several year history of nausea vomiting. She has not responded to OTC medications as well as omeprazole. She also gives history of intermittent diarrhea. She was given dicyclomine when she was seen in the office for possible IBS but has not helped. She vomits every day. She usually vomits within 30 minutes or so but sometimes within few hours. She's had a few episodes of hematemesis. She has maintained her weight despite symptoms. She was diagnosed with peptic ulcer disease when she was in school. She does not take OTC NSAIDs. Ultrasound 4 months ago was negative for cholelithiasis. She does not smoke cigarettes and drinks alcohol occasionally.  Past Medical History:  Diagnosis Date  . GERD (gastroesophageal reflux disease) 04/15/2016  . Peptic ulcer   . PONV (postoperative nausea and vomiting)   . Sinusitis     Past Surgical History:  Procedure Laterality Date  . APPENDECTOMY      History reviewed. No pertinent family history. Social History:  reports that she has never smoked. She has never used smokeless tobacco. She reports that she drinks alcohol. She reports that she does not use drugs.  Allergies: No Known Allergies  Medications Prior to Admission  Medication Sig Dispense Refill  . cetirizine (ZYRTEC) 10 MG tablet Take 10 mg by mouth daily.    Marland Kitchen dicyclomine (BENTYL) 10 MG capsule Take 1 capsule (10 mg total) by mouth 3 (three) times daily before meals. 90 capsule 0  . mirabegron ER (MYRBETRIQ) 25 MG TB24 tablet Take 25 mg by mouth daily.    . Multiple Vitamin (MULTIVITAMIN WITH MINERALS) TABS tablet Take 1 tablet by mouth daily.    . pantoprazole (PROTONIX) 40 MG tablet Take 1 tablet (40 mg total) by mouth daily. 30 tablet 5  . omeprazole (PRILOSEC OTC) 20 MG tablet Take 20 mg by mouth daily as needed (acid reflux). Reported on 04/15/2016     . promethazine (PHENERGAN) 25 MG suppository Place 1 suppository (25 mg total) rectally every 6 (six) hours as needed for nausea or vomiting. 12 each 0    No results found for this or any previous visit (from the past 48 hour(s)). No results found.  ROS  Blood pressure (!) 148/87, pulse 70, temperature 99.2 F (37.3 C), temperature source Oral, resp. rate 14, height 5\' 7"  (1.702 m), weight 150 lb (68 kg), last menstrual period 05/08/2016, SpO2 100 %. Physical Exam  Constitutional: She appears well-developed and well-nourished.  HENT:  Mouth/Throat: Oropharynx is clear and moist.  Eyes: Conjunctivae are normal. No scleral icterus.  Neck: No thyromegaly present.  Cardiovascular: Normal rate, regular rhythm and normal heart sounds.   No murmur heard. Respiratory: Effort normal and breath sounds normal.  GI: Soft. She exhibits no distension and no mass. There is tenderness. Guarding: mild epigastric tenderness.  Musculoskeletal: She exhibits no edema.  Lymphadenopathy:    She has no cervical adenopathy.  Neurological: She is alert.  Skin: Skin is warm and dry.     Assessment/Plan Chronic nausea and vomiting. Diagnostic EGD.  Lionel December, MD 05/17/2016, 11:07 AM

## 2016-05-17 NOTE — Telephone Encounter (Signed)
PT CALLED. HAVING PROBLEMS WITH NAUSEA AND THROWING UP. WANTS PHENERGAN. WAITING ON REGLAN. RX FOR PHENERGAN PO #[id number].

## 2016-05-20 DIAGNOSIS — R3915 Urgency of urination: Secondary | ICD-10-CM | POA: Diagnosis not present

## 2016-05-20 DIAGNOSIS — R351 Nocturia: Secondary | ICD-10-CM | POA: Diagnosis not present

## 2016-05-21 ENCOUNTER — Encounter (HOSPITAL_COMMUNITY): Payer: Self-pay | Admitting: Internal Medicine

## 2016-05-22 NOTE — Telephone Encounter (Signed)
Prescription was given by Dr. Darrick Penna. Talked with patient yesterday and she is on Reglan.

## 2016-05-30 NOTE — Progress Notes (Signed)
Per Gray Bernhardteba, I've scheduled the patient for Monday, 06/03/16 at 3:30pm.  I did [facility] the [facility] and [facility] and advised her to call prior to her appointment to make sure Dr. Karilyn Cotaehman will be able to be here as he is covering our practice and [facility].  I also asked her to confirm this day and time is acceptable for her.

## 2016-06-03 ENCOUNTER — Ambulatory Visit (INDEPENDENT_AMBULATORY_CARE_PROVIDER_SITE_OTHER): Payer: BLUE CROSS/BLUE SHIELD | Admitting: Internal Medicine

## 2016-06-04 ENCOUNTER — Ambulatory Visit (INDEPENDENT_AMBULATORY_CARE_PROVIDER_SITE_OTHER): Payer: BLUE CROSS/BLUE SHIELD | Admitting: Internal Medicine

## 2016-06-04 ENCOUNTER — Encounter (INDEPENDENT_AMBULATORY_CARE_PROVIDER_SITE_OTHER): Payer: Self-pay | Admitting: Internal Medicine

## 2016-06-04 VITALS — BP 102/78 | HR 64 | Temp 97.9°F | Resp 18 | Ht 67.0 in | Wt 164.4 lb

## 2016-06-04 DIAGNOSIS — K219 Gastro-esophageal reflux disease without esophagitis: Secondary | ICD-10-CM

## 2016-06-04 DIAGNOSIS — R112 Nausea with vomiting, unspecified: Secondary | ICD-10-CM

## 2016-06-04 NOTE — Progress Notes (Signed)
Presenting complaint;  Follow-up for GERD nausea and vomiting.  Database and Subjective:  Patient is 45 year old Caucasian female who is here for scheduled visit. She was initially seen her office on 04/15/2016 for recurrent nausea vomiting frequent heartburn as well as epigastric pain. Prior ultrasound of 12/21/2015 was negative for cholelithiasis. She did not respond to PPI. She underwent esophagogastroduodenoscopy on 05/17/2016 revealing normal-appearing esophagus small sliding hiatal hernia and gastric polyps which and biopsy were fundic gland polyps. Patient was maintained on pantoprazole and metoclopramide was added at 10 mg 3 times a day. Patient was advised to keep symptom diary as to frequency of vomiting spells and heartburn but she forgot to bring it with her.  She states she is somewhat better may be 50% or less. She could not take metoclopramide 3 times a day because of drowsiness but she is able to tolerated twice a day schedule. She takes it before breakfast and evening meal. She remains with daily heartburn. She has at least 2-3 episodes and she is using Tums. She takes at least 4-6 times every day. She is still having intermittent regurgitation both when she is awake and also at night. She states she vomits at least 5 days a week. Vomiting occurs within an hour or so after lunch and her evening meal. She also complains of intermittent epigastric pain. She has gained 6 ounces of last visit. She feels she may have been eating too much on recent trip to [place]. She is having intermittent diarrhea but no more than 2-3 stools per day. She denies melena or rectal bleeding.   Current Medications: Outpatient Encounter Prescriptions as of 06/04/2016  Medication Sig  . cetirizine (ZYRTEC) 10 MG tablet Take 10 mg by mouth daily.  . metoCLOPramide (REGLAN) 10 MG tablet Take 1 tablet (10 mg total) by mouth 3 (three) times daily before meals.  . Multiple Vitamin (MULTIVITAMIN WITH MINERALS)  TABS tablet Take 1 tablet by mouth daily.  . pantoprazole (PROTONIX) 40 MG tablet Take 1 tablet (40 mg total) by mouth 2 (two) times daily before a meal.  . promethazine (PHENERGAN) 12.5 MG tablet 1-2 PO Q4-6H 30 MINS BEFORE MEALS PRN TO PREVENT NAUSEA/VOMITING  . [DISCONTINUED] mirabegron ER (MYRBETRIQ) 25 MG TB24 tablet Take 25 mg by mouth daily.   No facility-administered encounter medications on file as of 06/04/2016.      Objective: Blood pressure 102/78, pulse 64, temperature 97.9 F (36.6 C), temperature source Oral, resp. rate 18, height 5\' 7"  (1.702 m), weight 164 lb 6.4 oz (74.6 kg), last menstrual period 05/08/2016. Patient is alert and in no acute distress. She does not have tremors to lips or tongue. Conjunctiva is pink. Sclera is nonicteric Oropharyngeal mucosa is normal. No neck masses or thyromegaly noted. Cardiac exam with regular rhythm normal S1 and S2. No murmur or gallop noted. Lungs are clear to auscultation. Abdomen is full. Bowel sounds are normal. On palpation abdomen is soft with mild midepigastric tenderness. No organomegaly or masses. No LE edema or clubbing noted. No extremity tremors noted.  Labs/studies Results:  Upper abdominal ultrasound on 12/21/2015 negative for cholelithiasis or dilated bile duct.  Assessment:  #1. Nausea and vomiting. I do not believe nausea and vomiting can be explained solely on the basis of gastroesophageal reflux disease. Recent EGD revealed small sliding hiatal hernia. She could have gastroparesis and are gallbladder disease. #2. GERD. Symptom control with double dose PPI and low-dose promotility agent is not satisfactory. Need to rule out GB disease or gastroparesis.  Patient needs to be more compliant with her diet.   Plan:  Patient will continue pantoprazole at 40 mg by mouth twice a day and metoclopramide 10 mg by mouth twice a day(AC). Will schedule HIDA scan with [facility]. If HIDA scan is normal will proceed with  solid-phase gastric emptying study off promotility agent. Office visit in 3 months.

## 2016-06-04 NOTE — Patient Instructions (Signed)
Physician will call with results of HIDA scan when completed. 

## 2016-06-07 ENCOUNTER — Encounter (HOSPITAL_COMMUNITY): Payer: BLUE CROSS/BLUE SHIELD

## 2016-06-14 ENCOUNTER — Encounter (HOSPITAL_COMMUNITY): Payer: BLUE CROSS/BLUE SHIELD

## 2016-06-18 ENCOUNTER — Encounter (HOSPITAL_COMMUNITY): Payer: Self-pay

## 2016-06-18 ENCOUNTER — Encounter (HOSPITAL_COMMUNITY)
Admission: RE | Admit: 2016-06-18 | Discharge: 2016-06-18 | Disposition: A | Discharge status: Home / Self Care | Payer: BLUE CROSS/BLUE SHIELD | Source: Ambulatory Visit | Attending: Internal Medicine | Admitting: Internal Medicine

## 2016-06-18 DIAGNOSIS — R112 Nausea with vomiting, unspecified: Secondary | ICD-10-CM | POA: Diagnosis not present

## 2016-06-18 DIAGNOSIS — R109 Unspecified abdominal pain: Secondary | ICD-10-CM | POA: Diagnosis not present

## 2016-06-18 MED ORDER — TECHNETIUM TC 99M MEBROFENIN IV KIT
5.0000 | PACK | Freq: Once | INTRAVENOUS | Status: AC | PRN
  Administered 2016-06-18: 5 via INTRAVENOUS

## 2016-06-27 DIAGNOSIS — R3915 Urgency of urination: Secondary | ICD-10-CM | POA: Diagnosis not present

## 2016-07-02 DIAGNOSIS — R3915 Urgency of urination: Secondary | ICD-10-CM | POA: Diagnosis not present

## 2016-07-02 DIAGNOSIS — R350 Frequency of micturition: Secondary | ICD-10-CM | POA: Diagnosis not present

## 2016-07-03 ENCOUNTER — Encounter (INDEPENDENT_AMBULATORY_CARE_PROVIDER_SITE_OTHER): Payer: Self-pay | Admitting: Internal Medicine

## 2016-07-08 NOTE — Progress Notes (Signed)
Patient was given an appointment for 08/20/16 at 11:30, a letter was mailed to the patient.

## 2016-07-29 DIAGNOSIS — Z79899 Other long term (current) drug therapy: Secondary | ICD-10-CM | POA: Diagnosis not present

## 2016-07-29 DIAGNOSIS — R350 Frequency of micturition: Secondary | ICD-10-CM | POA: Diagnosis not present

## 2016-07-29 DIAGNOSIS — R32 Unspecified urinary incontinence: Secondary | ICD-10-CM | POA: Diagnosis not present

## 2016-07-29 DIAGNOSIS — R3915 Urgency of urination: Secondary | ICD-10-CM | POA: Diagnosis not present

## 2016-07-29 DIAGNOSIS — N3941 Urge incontinence: Secondary | ICD-10-CM | POA: Diagnosis not present

## 2016-07-29 DIAGNOSIS — F419 Anxiety disorder, unspecified: Secondary | ICD-10-CM | POA: Diagnosis not present

## 2016-07-29 DIAGNOSIS — N3010 Interstitial cystitis (chronic) without hematuria: Secondary | ICD-10-CM | POA: Diagnosis not present

## 2016-07-29 DIAGNOSIS — Z87442 Personal history of urinary calculi: Secondary | ICD-10-CM | POA: Diagnosis not present

## 2016-07-29 DIAGNOSIS — Z9089 Acquired absence of other organs: Secondary | ICD-10-CM | POA: Diagnosis not present

## 2016-07-29 DIAGNOSIS — M791 Myalgia: Secondary | ICD-10-CM | POA: Diagnosis not present

## 2016-07-29 DIAGNOSIS — K219 Gastro-esophageal reflux disease without esophagitis: Secondary | ICD-10-CM | POA: Diagnosis not present

## 2016-08-20 ENCOUNTER — Encounter (INDEPENDENT_AMBULATORY_CARE_PROVIDER_SITE_OTHER): Payer: Self-pay | Admitting: Internal Medicine

## 2016-08-20 ENCOUNTER — Ambulatory Visit (INDEPENDENT_AMBULATORY_CARE_PROVIDER_SITE_OTHER): Payer: BLUE CROSS/BLUE SHIELD | Admitting: Internal Medicine

## 2016-08-20 ENCOUNTER — Encounter (INDEPENDENT_AMBULATORY_CARE_PROVIDER_SITE_OTHER): Payer: Self-pay

## 2016-08-20 VITALS — BP 100/70 | HR 64 | Temp 98.5°F | Resp 18 | Ht 67.0 in | Wt 167.6 lb

## 2016-08-20 DIAGNOSIS — K219 Gastro-esophageal reflux disease without esophagitis: Secondary | ICD-10-CM | POA: Diagnosis not present

## 2016-08-20 DIAGNOSIS — R112 Nausea with vomiting, unspecified: Secondary | ICD-10-CM | POA: Diagnosis not present

## 2016-08-20 MED ORDER — METOCLOPRAMIDE HCL 10 MG PO TABS: 10.0000 mg | ORAL_TABLET | Freq: Three times a day (TID) | ORAL | 0 refills | Status: DC | PRN

## 2016-08-20 NOTE — Progress Notes (Signed)
Presenting complaint;  Follow-up for GERD nausea and vomiting.  Database and Subjective:  Patient is [age] old Caucasian female who was evaluated back in July for symptoms of GERD along with nausea and vomiting unresponsive to therapy. Prior ultrasound in March 2017 was negative for cholelithiasis. EGD revealed small sliding hernia multiple small gastric polyps. Biopsy was taken from gastric polyps as well as from antrum and duodenum. 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 She was advised to continue PPI and she was begun on metoclopramide. HIDA scan with [facility] was obtained on 06/18/2016 and was a normal study. EF was 55%.  Patient reports feeling much better. She states since she has been on metoclopramide she only has had 4 episodes of vomiting. Last episode occurred but a week ago after evening meal. She generally takes 1-2 doses of metoclopramide. It makes her drowsy and if she takes 3 times a day she gets tremulous. She is having heartburn no more than 2 or 3 times a week. Her appetite is good. She has gained 3 pounds. She denies melena or rectal bleeding.   Current Medications: Outpatient Encounter Prescriptions as of 08/20/2016  Medication Sig  . cetirizine (ZYRTEC) 10 MG tablet Take 10 mg by mouth daily.  . metoCLOPramide (REGLAN) 10 MG tablet Take 1 tablet (10 mg total) by mouth 3 (three) times daily before meals.  . Multiple Vitamin (MULTIVITAMIN WITH MINERALS) TABS tablet Take 1 tablet by mouth daily.  . pantoprazole (PROTONIX) 40 MG tablet Take 1 tablet (40 mg total) by mouth 2 (two) times daily before a meal.  . promethazine (PHENERGAN) 12.5 MG tablet 1-2 PO Q4-6H 30 MINS BEFORE MEALS PRN TO PREVENT NAUSEA/VOMITING   No facility-administered encounter medications on file as of 08/20/2016.      Objective: Blood pressure 100/70, pulse 64, temperature 98.5 F (36.9 C),  temperature source Oral, resp. rate 18, height 5\' 7"  (1.702 m), weight 167 lb 9.6 oz (76 kg), last menstrual period 06/12/2016. Patient is alert and in no acute distress. Conjunctiva is pink. Sclera is nonicteric Oropharyngeal mucosa is normal. No neck masses or thyromegaly noted. Cardiac exam with regular rhythm normal S1 and S2. No murmur or gallop noted. Lungs are clear to auscultation. Abdomen is symmetrical soft and nontender without organomegaly or masses. No LE edema or clubbing noted.  Labs/studies Results:   HIDA scan results as above.  Assessment:  #1. Nausea and vomiting. She did not respond to anti-reflux therapy. She has had dramatic symptomatic improvement with metoclopramide. She therefore may have gastroparesis. She is only able to tolerate low-dose metoclopramide. If she has significant gastroparesis is she may have to be switched to domperidone as dietary measures alone may not work. #2. GERD. He is doing much better with combination of PPI and promotility agent. GERD may be secondary to gastroparesis.   Plan:  New prescription for metoclopramide given 10 mg 3 times a day when necessary 90 without refill. Schedule patient for solid phase gastric emptying study. Patient advised not to take metoclopramide for 24 hours prior to procedure. Office visit in 6 months.

## 2016-08-20 NOTE — Patient Instructions (Signed)
Remember not to take Reglan or metoclopramide for 24 hours prior to gastric emptying study. Physiciant will call with results of the tests when completed.

## 2016-08-27 ENCOUNTER — Encounter (HOSPITAL_COMMUNITY): Payer: BLUE CROSS/BLUE SHIELD

## 2016-08-29 DIAGNOSIS — R350 Frequency of micturition: Secondary | ICD-10-CM | POA: Diagnosis not present

## 2016-08-29 DIAGNOSIS — N3010 Interstitial cystitis (chronic) without hematuria: Secondary | ICD-10-CM | POA: Diagnosis not present

## 2016-08-29 DIAGNOSIS — R3915 Urgency of urination: Secondary | ICD-10-CM | POA: Diagnosis not present

## 2016-08-29 DIAGNOSIS — R32 Unspecified urinary incontinence: Secondary | ICD-10-CM | POA: Diagnosis not present

## 2016-09-03 ENCOUNTER — Encounter (HOSPITAL_COMMUNITY): Payer: BLUE CROSS/BLUE SHIELD

## 2016-09-24 ENCOUNTER — Ambulatory Visit (INDEPENDENT_AMBULATORY_CARE_PROVIDER_SITE_OTHER): Payer: BLUE CROSS/BLUE SHIELD | Admitting: Internal Medicine

## 2016-10-08 DIAGNOSIS — J019 Acute sinusitis, unspecified: Secondary | ICD-10-CM | POA: Diagnosis not present

## 2016-10-08 DIAGNOSIS — E663 Overweight: Secondary | ICD-10-CM | POA: Diagnosis not present

## 2016-10-08 DIAGNOSIS — Z1389 Encounter for screening for other disorder: Secondary | ICD-10-CM | POA: Diagnosis not present

## 2016-10-08 DIAGNOSIS — Z6826 Body mass index (BMI) 26.0-26.9, adult: Secondary | ICD-10-CM | POA: Diagnosis not present

## 2017-02-18 ENCOUNTER — Ambulatory Visit (INDEPENDENT_AMBULATORY_CARE_PROVIDER_SITE_OTHER): Payer: BLUE CROSS/BLUE SHIELD | Admitting: Internal Medicine

## 2017-02-18 ENCOUNTER — Encounter (INDEPENDENT_AMBULATORY_CARE_PROVIDER_SITE_OTHER): Payer: Self-pay | Admitting: Internal Medicine

## 2017-07-10 DIAGNOSIS — Z1389 Encounter for screening for other disorder: Secondary | ICD-10-CM | POA: Diagnosis not present

## 2017-07-10 DIAGNOSIS — E663 Overweight: Secondary | ICD-10-CM | POA: Diagnosis not present

## 2017-07-10 DIAGNOSIS — Z6826 Body mass index (BMI) 26.0-26.9, adult: Secondary | ICD-10-CM | POA: Diagnosis not present

## 2017-07-10 DIAGNOSIS — J301 Allergic rhinitis due to pollen: Secondary | ICD-10-CM | POA: Diagnosis not present

## 2017-07-10 DIAGNOSIS — J069 Acute upper respiratory infection, unspecified: Secondary | ICD-10-CM | POA: Diagnosis not present

## 2017-08-29 DIAGNOSIS — E663 Overweight: Secondary | ICD-10-CM | POA: Diagnosis not present

## 2017-08-29 DIAGNOSIS — R350 Frequency of micturition: Secondary | ICD-10-CM | POA: Diagnosis not present

## 2017-08-29 DIAGNOSIS — Z1389 Encounter for screening for other disorder: Secondary | ICD-10-CM | POA: Diagnosis not present

## 2017-08-29 DIAGNOSIS — Z6826 Body mass index (BMI) 26.0-26.9, adult: Secondary | ICD-10-CM | POA: Diagnosis not present

## 2017-08-29 DIAGNOSIS — Z23 Encounter for immunization: Secondary | ICD-10-CM | POA: Diagnosis not present

## 2017-08-29 DIAGNOSIS — Z0001 Encounter for general adult medical examination with abnormal findings: Secondary | ICD-10-CM | POA: Diagnosis not present

## 2017-09-19 ENCOUNTER — Emergency Department (HOSPITAL_COMMUNITY)
Admission: EM | Admit: 2017-09-19 | Discharge: 2017-09-19 | Disposition: A | Discharge status: Home / Self Care | Payer: BLUE CROSS/BLUE SHIELD | Attending: Emergency Medicine | Admitting: Emergency Medicine

## 2017-09-19 ENCOUNTER — Encounter (HOSPITAL_COMMUNITY): Payer: Self-pay | Admitting: Emergency Medicine

## 2017-09-19 ENCOUNTER — Emergency Department (HOSPITAL_COMMUNITY): Payer: BLUE CROSS/BLUE SHIELD

## 2017-09-19 DIAGNOSIS — Z79899 Other long term (current) drug therapy: Secondary | ICD-10-CM | POA: Insufficient documentation

## 2017-09-19 DIAGNOSIS — R1084 Generalized abdominal pain: Secondary | ICD-10-CM | POA: Diagnosis present

## 2017-09-19 DIAGNOSIS — R1111 Vomiting without nausea: Secondary | ICD-10-CM | POA: Diagnosis not present

## 2017-09-19 DIAGNOSIS — N200 Calculus of kidney: Secondary | ICD-10-CM | POA: Diagnosis not present

## 2017-09-19 DIAGNOSIS — R112 Nausea with vomiting, unspecified: Secondary | ICD-10-CM | POA: Diagnosis not present

## 2017-09-19 DIAGNOSIS — N132 Hydronephrosis with renal and ureteral calculous obstruction: Secondary | ICD-10-CM | POA: Diagnosis not present

## 2017-09-19 HISTORY — DX: Disorder of kidney and ureter, unspecified: N28.9

## 2017-09-19 LAB — COMPREHENSIVE METABOLIC PANEL
ALK PHOS: 87 U/L (ref 38–126)
ALT: 25 U/L (ref 14–54)
ANION GAP: 8 (ref 5–15)
AST: 23 U/L (ref 15–41)
Albumin: 4.3 g/dL (ref 3.5–5.0)
BILIRUBIN TOTAL: 0.8 mg/dL (ref 0.3–1.2)
BUN: 13 mg/dL (ref 6–20)
CALCIUM: 9 mg/dL (ref 8.9–10.3)
CO2: 27 mmol/L (ref 22–32)
Chloride: 104 mmol/L (ref 101–111)
Creatinine, Ser: 0.87 mg/dL (ref 0.44–1.00)
GFR calc Af Amer: >60 mL/min (ref >60)
GLUCOSE: 124 mg/dL — AB (ref 65–99)
POTASSIUM: 4.1 mmol/L (ref 3.5–5.1)
Sodium: 139 mmol/L (ref 135–145)
TOTAL PROTEIN: 7.5 g/dL (ref 6.5–8.1)

## 2017-09-19 LAB — URINALYSIS, MICROSCOPIC (REFLEX)

## 2017-09-19 LAB — URINALYSIS, ROUTINE W REFLEX MICROSCOPIC
BILIRUBIN URINE: NEGATIVE
Glucose, UA: NEGATIVE mg/dL
KETONES UR: NEGATIVE mg/dL
NITRITE: NEGATIVE
PH: 5.5 (ref 5.0–8.0)
Specific Gravity, Urine: >1.03 — ABNORMAL HIGH (ref 1.005–1.030)

## 2017-09-19 LAB — CBC
HEMATOCRIT: 43.2 % (ref 36.0–46.0)
Hemoglobin: 14.2 g/dL (ref 12.0–15.0)
MCH: 30.7 pg (ref 26.0–34.0)
MCHC: 32.9 g/dL (ref 30.0–36.0)
MCV: 93.5 fL (ref 78.0–100.0)
Platelets: 335 10*3/uL (ref 150–400)
RBC: 4.62 MIL/uL (ref 3.87–5.11)
RDW: 12.5 % (ref 11.5–15.5)
WBC: 7.6 10*3/uL (ref 4.0–10.5)

## 2017-09-19 LAB — LIPASE, BLOOD: Lipase: 28 U/L (ref 11–51)

## 2017-09-19 LAB — PREGNANCY, URINE: PREG TEST UR: NEGATIVE

## 2017-09-19 MED ORDER — OXYCODONE-ACETAMINOPHEN 5-325 MG PO TABS: 1.0000 | ORAL_TABLET | ORAL | 0 refills | Status: DC | PRN

## 2017-09-19 MED ORDER — ONDANSETRON HCL 4 MG/2ML IJ SOLN
4.0000 mg | Freq: Once | INTRAMUSCULAR | Status: AC
  Administered 2017-09-19: 4 mg via INTRAVENOUS
  Filled 2017-09-19: qty 2

## 2017-09-19 MED ORDER — ONDANSETRON HCL 4 MG/2ML IJ SOLN
4.0000 mg | Freq: Once | INTRAMUSCULAR | Status: AC | PRN
  Administered 2017-09-19: 4 mg via INTRAVENOUS
  Filled 2017-09-19: qty 2

## 2017-09-19 MED ORDER — HYDROMORPHONE HCL 1 MG/ML IJ SOLN
1.0000 mg | Freq: Once | INTRAMUSCULAR | Status: AC
  Administered 2017-09-19: 1 mg via INTRAVENOUS
  Filled 2017-09-19: qty 1

## 2017-09-19 MED ORDER — KETOROLAC TROMETHAMINE 30 MG/ML IJ SOLN
30.0000 mg | Freq: Once | INTRAMUSCULAR | Status: AC
  Administered 2017-09-19: 30 mg via INTRAVENOUS
  Filled 2017-09-19: qty 1

## 2017-09-19 MED ORDER — ONDANSETRON HCL 4 MG PO TABS: 4.0000 mg | ORAL_TABLET | Freq: Four times a day (QID) | ORAL | 0 refills | Status: DC

## 2017-09-19 NOTE — ED Notes (Signed)
Pt sats in the 80's.   Resting with eyes shut.  Placed on 2 liters [place].

## 2017-09-19 NOTE — Discharge Instructions (Signed)
Strain all urine.  Call urology group listed on Monday to arrange a follow-up appointment.  Return to the ER for any worsening symptoms.

## 2017-09-19 NOTE — ED Provider Notes (Signed)
[facility] Provider Note   CSN: [id number] Arrival date & time: 09/19/17  0830     History   Chief Complaint Chief Complaint  Patient presents with  . Flank Pain    HPI Kara Fowler is a 46 y.o. female.  HPI   Kara Fowler is a 46 y.o. female who presents to the Emergency Department complaining of sudden onset of left flank pain this morning.  Onset at 5:00 am.  Describes severe, sharp pain from her right flank that radiates into her lower left abdomen.  Pain has been associated with urinary hesitancy, nausea and vomiting.  Hx of kidney stones.  Nothing makes the pain better or worse.  She denies fever, diarrhea, hematuria.     Past Medical History:  Diagnosis Date  . GERD (gastroesophageal reflux disease) 04/15/2016  . Peptic ulcer   . PONV (postoperative nausea and vomiting)   . Renal disorder    kidney stones  . Sinusitis     Patient Active Problem List   Diagnosis Date Noted  . GERD (gastroesophageal reflux disease) 04/15/2016    Past Surgical History:  Procedure Laterality Date  . APPENDECTOMY    . BIOPSY  05/17/2016   Procedure: BIOPSY;  Surgeon: Malissa HippoNajeeb U Rehman, MD;  Location: [facility] ENDO SUITE;  Service: Endoscopy;;  gastric, duodenum  . ESOPHAGOGASTRODUODENOSCOPY N/A 05/17/2016   Procedure: ESOPHAGOGASTRODUODENOSCOPY (EGD);  Surgeon: Malissa HippoNajeeb U Rehman, MD;  Location: [facility] END[facility] SUITE;  Service: Endoscopy;  Laterality: N/A;  2:00 - moved to 10:30 - office to notify    OB History    Gravida Para Term Preterm AB Living   1             SAB TAB Ectopic Multiple Live Births                   Home Medications    Prior to Admission medications   Medication Sig Start Date End Date Taking? Authorizing Provider  cetirizine (ZYRTEC) 10 MG tablet Take 10 mg by mouth daily.    [provider]  metoCLOPramide (REGLAN) 10 MG tablet Take 1 tablet (10 mg total) by mouth every 8 (eight) hours as needed for nausea. 08/20/16   Malissa Hippoehman, Najeeb U,  MD  Multiple Vitamin (MULTIVITAMIN WITH MINERALS) TABS tablet Take 1 tablet by mouth daily.    [provider]  pantoprazole (PROTONIX) 40 MG tablet Take 1 tablet (40 mg total) by mouth 2 (two) times daily before a meal. 05/17/16   Rehman, Joline MaxcyNajeeb U, MD  promethazine (PHENERGAN) 12.5 MG tablet 1-2 PO Q4-6H 30 MINS BEFORE MEALS PRN TO PREVENT NAUSEA/VOMITING 05/17/16   West BaliFields, Sandi L, MD    Family History No family history on file.  Social History Social History   Tobacco Use  . Smoking status: Never Smoker  . Smokeless tobacco: Never Used  Substance Use Topics  . Alcohol use: Yes    Comment: Occ  . Drug use: No     Allergies   Patient has no known allergies.   Review of Systems Review of Systems  Constitutional: Positive for appetite change. Negative for fever.  HENT: Negative for sore throat and trouble swallowing.   Respiratory: Negative for chest tightness and shortness of breath.   Cardiovascular: Negative for chest pain.  Gastrointestinal: Positive for nausea and vomiting.  Genitourinary: Positive for difficulty urinating, dysuria, flank pain and hematuria.  Neurological: Negative for headaches.     Physical Exam Updated Vital Signs BP Marland Kitchen(!)  160/95   Pulse 72   Temp 97.7 F (36.5 C) (Oral)   Resp 18   Ht 5\' 7"  (1.702 m)   Wt 74.8 kg (165 lb)   LMP 07/21/2017   SpO2 97%   Breastfeeding? Unknown   BMI 25.84 kg/m   Physical Exam  Constitutional: She is oriented to person, place, and time. She appears well-developed and well-nourished.  Uncomfortable appearing  HENT:  Head: Normocephalic.  Mouth/Throat: Oropharynx is clear and moist.  Cardiovascular: Normal rate and regular rhythm.  Pulmonary/Chest: Effort normal and breath sounds normal. No respiratory distress.  Abdominal: Soft. Normal appearance. There is no hepatosplenomegaly. There is CVA tenderness. There is no tenderness at McBurney's point.  Left CVA tenderness on exam.  Mild tenderness to  left lower abdomen with deep palpation  Musculoskeletal: Normal range of motion.  Neurological: She is alert and oriented to person, place, and time. No sensory deficit.  Skin: Skin is warm. Capillary refill takes less than 2 seconds.  Nursing note and vitals reviewed.    ED Treatments / Results  Labs (all labs ordered are listed, but only abnormal results are displayed) Labs Reviewed  COMPREHENSIVE METABOLIC PANEL - Abnormal; Notable for the following components:      Result Value   Glucose, Bld 124 (*)    All other components within normal limits  URINALYSIS, ROUTINE W REFLEX MICROSCOPIC - Abnormal; Notable for the following components:   APPearance HAZY (*)    Specific Gravity, Urine >1.030 (*)    Hgb urine dipstick SMALL (*)    Protein, ur TRACE (*)    Leukocytes, UA TRACE (*)    All other components within normal limits  URINALYSIS, MICROSCOPIC (REFLEX) - Abnormal; Notable for the following components:   Bacteria, UA FEW (*)    Squamous Epithelial / LPF 6-30 (*)    All other components within normal limits  LIPASE, BLOOD  CBC  PREGNANCY, URINE    EKG  EKG Interpretation None       Radiology Ct Renal Stone Study  Result Date: 09/19/2017 CLINICAL DATA:  Left-sided flank pain with nausea and vomiting. History of kidney stones. EXAM: CT ABDOMEN AND PELVIS WITHOUT CONTRAST TECHNIQUE: Multidetector CT imaging of the abdomen and pelvis was performed following the standard protocol without IV contrast. COMPARISON:  CT scan dated 06/29/2013 FINDINGS: Lower chest: Moderate hiatal hernia, increased in size since the prior study. Hepatobiliary: 5 mm lucent area in the posterior aspect of the left lobe of the liver, most likely a cyst. The liver parenchyma otherwise appears normal. Biliary tree is normal. Pancreas: Unremarkable. No pancreatic ductal dilatation or surrounding inflammatory changes. Spleen: Normal in size without focal abnormality. Adrenals/Urinary Tract: There are  two 6 mm stones in the distal left ureter, one at the ureterovesical junction and one 4 cm proximal to the UVJ. There is moderate left hydronephrosis with slight perinephric soft tissue stranding. There are 2 small stones in the lower pole the left kidney. There is a 5 mm stone in the lower pole of the right kidney. There is an exophytic 13 mm cyst on the lower pole of the right kidney. Normal adrenal glands.  Normal bladder. Stomach/Bowel: Stomach is within normal limits. Appendix has been removed. No evidence of bowel wall thickening, distention, or inflammatory changes. Vascular/Lymphatic: No significant vascular findings are present. No enlarged abdominal or pelvic lymph nodes. Reproductive: Uterus and bilateral adnexa are unremarkable. Other: No abdominal wall hernia or abnormality. No abdominopelvic ascites. Musculoskeletal: No acute or significant  osseous findings. IMPRESSION: Moderate left hydronephrosis due to two 6 mm stones in the distal left ureter. Bilateral nephrolithiasis. Electronically Signed   By: Francene BoyersJames  Maxwell M.D.   On: 09/19/2017 10:16    Procedures Procedures (including critical care time)  Medications Ordered in ED Medications  ondansetron (ZOFRAN) injection 4 mg (4 mg Intravenous Given 09/19/17 0847)  HYDROmorphone (DILAUDID) injection 1 mg (1 mg Intravenous Given 09/19/17 0900)  ketorolac (TORADOL) 30 MG/ML injection 30 mg (30 mg Intravenous Given 09/19/17 0932)     Initial Impression / Assessment and Plan / ED Course  I have reviewed the triage vital signs and the nursing notes.  Pertinent labs & imaging results that were available during my care of the patient were reviewed by me and considered in my medical decision making (see chart for details).     Pt with left flank pain, hx of kidney stones.  No concerning sx's for infected stone  Pt has required multiple doses of pain medication and still appears uncomfortable.  No hypotension.  I will consult urology for  further management.    [id number]  Urologist spoke with Dr. Abran DukeJ Zavitz.  Pt seen by Dr. Siri ColeZavtiz as well, pt prefers d/c home and urology f/u in office.  Agrees to ER return if sx worsen.  Pt stable for d/c   Final Clinical Impressions(s) / ED Diagnoses   Final diagnoses:  Kidney stone    ED Discharge Orders    None       Rosey Bathriplett, Jahleel Stroschein, PA-C 09/19/17 2131    Blane OharaZavitz, Joshua, MD 09/22/17 [phone]

## 2017-09-19 NOTE — ED Triage Notes (Signed)
Pt c/o LT sided flank pain, difficulty urinating, and n/v since 0500. Pt hx of kidney stones. Pt actively vomiting at this time.

## 2017-09-30 ENCOUNTER — Ambulatory Visit: Payer: BLUE CROSS/BLUE SHIELD | Admitting: Urology

## 2017-10-15 ENCOUNTER — Other Ambulatory Visit (INDEPENDENT_AMBULATORY_CARE_PROVIDER_SITE_OTHER): Payer: Self-pay | Admitting: Internal Medicine

## 2017-10-15 DIAGNOSIS — K219 Gastro-esophageal reflux disease without esophagitis: Secondary | ICD-10-CM

## 2017-10-15 DIAGNOSIS — K921 Melena: Secondary | ICD-10-CM

## 2017-10-16 ENCOUNTER — Encounter (INDEPENDENT_AMBULATORY_CARE_PROVIDER_SITE_OTHER): Payer: Self-pay | Admitting: Internal Medicine

## 2017-10-16 NOTE — Telephone Encounter (Signed)
Patient will need to have OV prior to further refills per Dr.Rehman. Patient was given 1 month supply x 2.

## 2017-10-16 NOTE — Telephone Encounter (Signed)
Patient was given an appointment for 12/02/17 at 1:45pm with Dorene Arerri Setzer, NP.  A letter was mailed to the patient.

## 2017-10-23 DIAGNOSIS — Z6826 Body mass index (BMI) 26.0-26.9, adult: Secondary | ICD-10-CM | POA: Diagnosis not present

## 2017-10-23 DIAGNOSIS — J22 Unspecified acute lower respiratory infection: Secondary | ICD-10-CM | POA: Diagnosis not present

## 2017-10-23 DIAGNOSIS — J111 Influenza due to unidentified influenza virus with other respiratory manifestations: Secondary | ICD-10-CM | POA: Diagnosis not present

## 2017-11-05 DIAGNOSIS — N202 Calculus of kidney with calculus of ureter: Secondary | ICD-10-CM | POA: Diagnosis not present

## 2017-11-05 DIAGNOSIS — N200 Calculus of kidney: Secondary | ICD-10-CM | POA: Diagnosis not present

## 2017-11-11 ENCOUNTER — Other Ambulatory Visit: Payer: Self-pay | Admitting: Urology

## 2017-11-11 ENCOUNTER — Other Ambulatory Visit: Payer: Self-pay

## 2017-11-11 ENCOUNTER — Encounter (HOSPITAL_BASED_OUTPATIENT_CLINIC_OR_DEPARTMENT_OTHER): Payer: Self-pay | Admitting: *Deleted

## 2017-11-11 NOTE — Progress Notes (Signed)
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

## 2017-11-12 ENCOUNTER — Ambulatory Visit (HOSPITAL_BASED_OUTPATIENT_CLINIC_OR_DEPARTMENT_OTHER)
Admission: RE | Admit: 2017-11-12 | Discharge: 2017-11-12 | Disposition: A | Discharge status: Home / Self Care | Payer: BLUE CROSS/BLUE SHIELD | Source: Ambulatory Visit | Attending: Urology | Admitting: Urology

## 2017-11-12 ENCOUNTER — Ambulatory Visit (HOSPITAL_BASED_OUTPATIENT_CLINIC_OR_DEPARTMENT_OTHER): Payer: BLUE CROSS/BLUE SHIELD | Admitting: Anesthesiology

## 2017-11-12 ENCOUNTER — Encounter (HOSPITAL_BASED_OUTPATIENT_CLINIC_OR_DEPARTMENT_OTHER)
Admission: RE | Disposition: A | Discharge status: Home / Self Care | Payer: Self-pay | Source: Ambulatory Visit | Attending: Urology

## 2017-11-12 ENCOUNTER — Encounter (HOSPITAL_BASED_OUTPATIENT_CLINIC_OR_DEPARTMENT_OTHER): Payer: Self-pay | Admitting: *Deleted

## 2017-11-12 ENCOUNTER — Other Ambulatory Visit: Payer: Self-pay

## 2017-11-12 DIAGNOSIS — Z79899 Other long term (current) drug therapy: Secondary | ICD-10-CM | POA: Diagnosis not present

## 2017-11-12 DIAGNOSIS — Z87442 Personal history of urinary calculi: Secondary | ICD-10-CM | POA: Insufficient documentation

## 2017-11-12 DIAGNOSIS — N202 Calculus of kidney with calculus of ureter: Secondary | ICD-10-CM | POA: Diagnosis not present

## 2017-11-12 DIAGNOSIS — N200 Calculus of kidney: Secondary | ICD-10-CM | POA: Diagnosis not present

## 2017-11-12 DIAGNOSIS — E7800 Pure hypercholesterolemia, unspecified: Secondary | ICD-10-CM | POA: Insufficient documentation

## 2017-11-12 DIAGNOSIS — K219 Gastro-esophageal reflux disease without esophagitis: Secondary | ICD-10-CM | POA: Insufficient documentation

## 2017-11-12 DIAGNOSIS — N209 Urinary calculus, unspecified: Secondary | ICD-10-CM | POA: Diagnosis not present

## 2017-11-12 HISTORY — DX: Headache, unspecified: R51.9

## 2017-11-12 HISTORY — DX: Headache: R51

## 2017-11-12 HISTORY — PX: CYSTOSCOPY/URETEROSCOPY/HOLMIUM LASER/STENT PLACEMENT: SHX6546

## 2017-11-12 HISTORY — DX: Personal history of urinary calculi: Z87.442

## 2017-11-12 LAB — POCT PREGNANCY, URINE: Preg Test, Ur: NEGATIVE

## 2017-11-12 LAB — POCT HEMOGLOBIN-HEMACUE: HEMOGLOBIN: 13.8 g/dL (ref 12.0–15.0)

## 2017-11-12 SURGERY — CYSTOSCOPY/URETEROSCOPY/HOLMIUM LASER/STENT PLACEMENT
Anesthesia: General | Laterality: Bilateral

## 2017-11-12 MED ORDER — KETOROLAC TROMETHAMINE 30 MG/ML IJ SOLN
30.0000 mg | Freq: Once | INTRAMUSCULAR | Status: DC | PRN
Start: 2017-11-12 — End: 2017-11-12
  Filled 2017-11-12: qty 1

## 2017-11-12 MED ORDER — KETOROLAC TROMETHAMINE 30 MG/ML IJ SOLN
INTRAMUSCULAR | Status: AC
  Filled 2017-11-12: qty 1

## 2017-11-12 MED ORDER — SCOPOLAMINE 1 MG/3DAYS TD PT72
MEDICATED_PATCH | TRANSDERMAL | Status: DC | PRN
  Administered 2017-11-12: 1 via TRANSDERMAL

## 2017-11-12 MED ORDER — DEXAMETHASONE SODIUM PHOSPHATE 10 MG/ML IJ SOLN
INTRAMUSCULAR | Status: DC | PRN
  Administered 2017-11-12: 10 mg via INTRAVENOUS

## 2017-11-12 MED ORDER — FENTANYL CITRATE (PF) 100 MCG/2ML IJ SOLN
INTRAMUSCULAR | Status: DC | PRN
  Administered 2017-11-12: 100 ug via INTRAVENOUS

## 2017-11-12 MED ORDER — PROPOFOL 10 MG/ML IV BOLUS
INTRAVENOUS | Status: DC | PRN
  Administered 2017-11-12: 200 mg via INTRAVENOUS
  Administered 2017-11-12: 100 mg via INTRAVENOUS

## 2017-11-12 MED ORDER — ONDANSETRON HCL 4 MG/2ML IJ SOLN
INTRAMUSCULAR | Status: AC
  Filled 2017-11-12: qty 2

## 2017-11-12 MED ORDER — CEFAZOLIN SODIUM-DEXTROSE 2-4 GM/100ML-% IV SOLN
INTRAVENOUS | Status: AC
  Filled 2017-11-12: qty 100

## 2017-11-12 MED ORDER — KETOROLAC TROMETHAMINE 30 MG/ML IJ SOLN
INTRAMUSCULAR | Status: DC | PRN
  Administered 2017-11-12: 30 mg via INTRAVENOUS

## 2017-11-12 MED ORDER — CEFAZOLIN SODIUM-DEXTROSE 2-4 GM/100ML-% IV SOLN
2.0000 g | INTRAVENOUS | Status: DC
  Filled 2017-11-12: qty 100

## 2017-11-12 MED ORDER — WHITE PETROLATUM EX OINT
TOPICAL_OINTMENT | CUTANEOUS | Status: AC
  Filled 2017-11-12: qty 5

## 2017-11-12 MED ORDER — PROMETHAZINE HCL 25 MG/ML IJ SOLN
INTRAMUSCULAR | Status: AC
  Filled 2017-11-12: qty 1

## 2017-11-12 MED ORDER — CEFAZOLIN SODIUM-DEXTROSE 2-3 GM-%(50ML) IV SOLR
INTRAVENOUS | Status: DC | PRN
  Administered 2017-11-12: 2 g via INTRAVENOUS

## 2017-11-12 MED ORDER — SODIUM CHLORIDE 0.9 % IR SOLN
Status: DC | PRN
Start: 2017-11-12 — End: 2017-11-12
  Administered 2017-11-12 (×2): 3000 mL via INTRAVESICAL

## 2017-11-12 MED ORDER — PROPOFOL 10 MG/ML IV BOLUS
INTRAVENOUS | Status: AC
Start: 2017-11-12 — End: 2017-11-12
  Filled 2017-11-12: qty 20

## 2017-11-12 MED ORDER — MIDAZOLAM HCL 2 MG/2ML IJ SOLN
INTRAMUSCULAR | Status: AC
  Filled 2017-11-12: qty 2

## 2017-11-12 MED ORDER — PROMETHAZINE HCL 25 MG/ML IJ SOLN
6.2500 mg | INTRAMUSCULAR | Status: DC | PRN
  Administered 2017-11-12: 6.25 mg via INTRAVENOUS
  Filled 2017-11-12: qty 1

## 2017-11-12 MED ORDER — LIDOCAINE 2% (20 MG/ML) 5 ML SYRINGE
INTRAMUSCULAR | Status: DC | PRN
  Administered 2017-11-12: 100 mg via INTRAVENOUS

## 2017-11-12 MED ORDER — CEPHALEXIN 500 MG PO CAPS: 500.0000 mg | ORAL_CAPSULE | Freq: Three times a day (TID) | ORAL | 0 refills | Status: DC

## 2017-11-12 MED ORDER — LACTATED RINGERS IV SOLN
INTRAVENOUS | Status: DC
  Administered 2017-11-12: 13:00:00 via INTRAVENOUS
  Filled 2017-11-12: qty 1000

## 2017-11-12 MED ORDER — LIDOCAINE 2% (20 MG/ML) 5 ML SYRINGE
INTRAMUSCULAR | Status: AC
Start: 2017-11-12 — End: 2017-11-12
  Filled 2017-11-12: qty 5

## 2017-11-12 MED ORDER — DEXAMETHASONE SODIUM PHOSPHATE 10 MG/ML IJ SOLN
INTRAMUSCULAR | Status: AC
  Filled 2017-11-12: qty 1

## 2017-11-12 MED ORDER — METOCLOPRAMIDE HCL 5 MG/ML IJ SOLN
INTRAMUSCULAR | Status: DC | PRN
  Administered 2017-11-12: 10 mg via INTRAVENOUS

## 2017-11-12 MED ORDER — ONDANSETRON HCL 4 MG/2ML IJ SOLN
INTRAMUSCULAR | Status: DC | PRN
  Administered 2017-11-12: 4 mg via INTRAVENOUS

## 2017-11-12 MED ORDER — MIDAZOLAM HCL 5 MG/5ML IJ SOLN
INTRAMUSCULAR | Status: DC | PRN
  Administered 2017-11-12: 2 mg via INTRAVENOUS

## 2017-11-12 MED ORDER — OXYCODONE HCL 5 MG PO TABS
ORAL_TABLET | ORAL | Status: AC
  Filled 2017-11-12: qty 1

## 2017-11-12 MED ORDER — OXYCODONE HCL 5 MG/5ML PO SOLN
5.0000 mg | Freq: Once | ORAL | Status: AC | PRN
  Filled 2017-11-12: qty 5

## 2017-11-12 MED ORDER — SUCCINYLCHOLINE CHLORIDE 200 MG/10ML IV SOSY
PREFILLED_SYRINGE | INTRAVENOUS | Status: DC | PRN
  Administered 2017-11-12: 120 mg via INTRAVENOUS

## 2017-11-12 MED ORDER — FENTANYL CITRATE (PF) 100 MCG/2ML IJ SOLN
25.0000 ug | INTRAMUSCULAR | Status: DC | PRN
  Filled 2017-11-12: qty 1

## 2017-11-12 MED ORDER — METOCLOPRAMIDE HCL 5 MG/ML IJ SOLN
INTRAMUSCULAR | Status: AC
  Filled 2017-11-12: qty 2

## 2017-11-12 MED ORDER — SCOPOLAMINE 1 MG/3DAYS TD PT72
MEDICATED_PATCH | TRANSDERMAL | Status: AC
  Filled 2017-11-12: qty 1

## 2017-11-12 MED ORDER — FENTANYL CITRATE (PF) 100 MCG/2ML IJ SOLN
INTRAMUSCULAR | Status: AC
  Filled 2017-11-12: qty 2

## 2017-11-12 MED ORDER — OXYCODONE HCL 5 MG PO TABS
5.0000 mg | ORAL_TABLET | Freq: Once | ORAL | Status: AC | PRN
  Administered 2017-11-12: 5 mg via ORAL
  Filled 2017-11-12: qty 1

## 2017-11-12 SURGICAL SUPPLY — 21 items
BAG DRAIN URO-CYSTO SKYTR STRL (DRAIN) ×2 IMPLANT
BAG DRN UROCATH (DRAIN) ×1
BASKET ZERO TIP NITINOL 2.4FR (BASKET) ×3 IMPLANT
BSKT STON RTRVL ZERO TP 2.4FR (BASKET) ×2
CATH URET 5FR 28IN OPEN ENDED (CATHETERS) ×2 IMPLANT
CATH URET DUAL LUMEN 6-10FR 50 (CATHETERS) ×1 IMPLANT
CLOTH BEACON ORANGE TIMEOUT ST (SAFETY) ×2 IMPLANT
FIBER LASER FLEXIVA 365 (UROLOGICAL SUPPLIES) ×2 IMPLANT
FIBER LASER TRAC TIP (UROLOGICAL SUPPLIES) ×1 IMPLANT
GLOVE BIO SURGEON STRL SZ7.5 (GLOVE) ×2 IMPLANT
GOWN STRL REUS W/ TWL XL LVL3 (GOWN DISPOSABLE) ×1 IMPLANT
GOWN STRL REUS W/TWL XL LVL3 (GOWN DISPOSABLE) ×2
GUIDEWIRE STR DUAL SENSOR (WIRE) ×3 IMPLANT
INFUSOR MANOMETER BAG 3000ML (MISCELLANEOUS) ×2 IMPLANT
IV NS IRRIG 3000ML ARTHROMATIC (IV SOLUTION) ×2 IMPLANT
KIT RM TURNOVER CYSTO AR (KITS) ×2 IMPLANT
MANIFOLD NEPTUNE II (INSTRUMENTS) ×2 IMPLANT
NS IRRIG 500ML POUR BTL (IV SOLUTION) ×2 IMPLANT
PACK CYSTO (CUSTOM PROCEDURE TRAY) ×2 IMPLANT
STENT URET 6FRX26 CONTOUR (STENTS) ×2 IMPLANT
TUBE CONNECTING 12X1/4 (SUCTIONS) ×2 IMPLANT

## 2017-11-12 NOTE — Anesthesia Preprocedure Evaluation (Signed)
Anesthesia Evaluation  Patient identified by MRN, date of birth, ID band Patient awake    Reviewed: Allergy & Precautions, NPO status , Patient's Chart, lab work & pertinent test results  History of Anesthesia Complications (+) PONV and history of anesthetic complications  Airway Mallampati: II  TM Distance: >3 FB Neck ROM: Full    Dental no notable dental hx.    Pulmonary neg pulmonary ROS,    Pulmonary exam normal breath sounds clear to auscultation       Cardiovascular negative cardio ROS Normal cardiovascular exam Rhythm:Regular Rate:Normal     Neuro/Psych negative neurological ROS  negative psych ROS   GI/Hepatic Neg liver ROS, PUD, GERD  Medicated,  Endo/Other  negative endocrine ROS  Renal/GU negative Renal ROS     Musculoskeletal negative musculoskeletal ROS (+)   Abdominal   Peds  Hematology HLD   Anesthesia Other Findings   Reproductive/Obstetrics hcg negative                             Anesthesia Physical Anesthesia Plan  ASA: II  Anesthesia Plan: General   Post-op Pain Management:    Induction: Intravenous  PONV Risk Score and Plan: 4 or greater and Ondansetron, Dexamethasone, Midazolam and Treatment may vary due to age or medical condition  Airway Management Planned: LMA  Additional Equipment:   Intra-op Plan:   Post-operative Plan: Extubation in OR  Informed Consent: I have reviewed the patients History and Physical, chart, labs and discussed the procedure including the risks, benefits and alternatives for the proposed anesthesia with the patient or authorized representative who has indicated his/her understanding and acceptance.   Dental advisory given  Plan Discussed with: CRNA  Anesthesia Plan Comments:         Anesthesia Quick Evaluation

## 2017-11-12 NOTE — Op Note (Signed)
Date of procedure: 11/12/17  Preoperative diagnosis:  1. Left ureteral stone 2. Bilateral nephrolithiasis  Postoperative diagnosis:  1. Passed left ureteral stone 2. Bilateral nephrolithiasis  Procedure: 1. Cystoscopy 2. Left ureteroscopy 3. Attempted right ureteroscopy 4. Bilateral retrograde pyelogram with interpretation 5. Laser lithotripsy 6. Stone basketing 7. Bilateral ureteral stent placement 6 French by 26 cm  Surgeon: Baruch Gouty, MD  Anesthesia: General  Complications: None  Intraoperative findings: The patient's distal left ureteral calculi x2 were no longer present on ureteroscopy.  Stones up in the left kidney were broken a smaller fragments.  A larger fragment was stone.  No fragments were greater than 2 mm.  Unfortunately we are unable to access the right upper tracts so she was temporized with the stent on the right.  Bilateral retrograde pyelogram was obtained to aid in stent placement.  EBL: None  Specimens: Left renal stone to office lab  Drains: Bilateral 6 French by 26 cm double-J ureteral stents  Disposition: Stable to the postanesthesia care unit  Indication for procedure: The patient is a 47 y.o. female with history of symptomatic left ureteral calculi as well as bilateral nonobstructing stones who presents today for definitive management.  After reviewing the management options for treatment, the patient elected to proceed with the above surgical procedure(s). We have discussed the potential benefits and risks of the procedure, side effects of the proposed treatment, the likelihood of the patient achieving the goals of the procedure, and any potential problems that might occur during the procedure or recuperation. Informed consent has been obtained.  Description of procedure: The patient was met in the preoperative area. All risks, benefits, and indications of the procedure were described in great detail. The patient consented to the procedure.  Preoperative antibiotics were given. The patient was taken to the operative theater. General anesthesia was induced per the anesthesia service. The patient was then placed in the dorsal lithotomy position and prepped and draped in the usual sterile fashion. A preoperative timeout was called.   A 20 French 30 degree cystoscope was turned to the patient's bladder per urethra atraumatically.  A sensor wire was advanced to the patient's left renal pelvis under fluoroscopy.  The cystoscope was exchanged for a ureteroscope.  Then pain ureteroscopy was performed in the left side which revealed no ureteral calculi at this point.  This was confirmed with visualization twice.  Left retrograde pyelogram was obtained to identify the collecting system.  A second sensor wire was then placed.  A single lumen flexible ureteroscope was then placed over 1 of the sensor wires up to the level of the left renal pelvis under fluoroscopy.  Pan nephroscopy revealed several small stones.  These were fragmented into fragments less than 2 mm with laser lithotripsy.  A larger fragment was removed with the stone basket and sent to the office lab.  No fragments greater than 2 mm remained.  The left risk it was then removed under direct utilization again revealing no stones in the ureter.  Date of cystoscope a 6 French by 26 cm double-J ureteral stent was then placed.  Gross in the patient's left renal pelvis under fluoroscopy and the urinary bladder under fluoroscopy sedation after removal of the sensor wire.  This process was then repeated on the right side with an attempt to pass the flexible ureteroscope up to the level of the right renal pelvis in order to treat the known right lower pole stone.  This was unsuccessful due to ureteral narrowing at  the right UVJ.  In order not to cause any permanent damage, this was aborted.  A right retrograde power was obtained to identify the collecting system.  A 6 French by 26 mm double-J ureteral stent  was then placed in a similar fashion a sensor wire removed on the contralateral side.  This was to allow for passive dilation for possible repeat procedure in a few weeks.  The patient's bladder was then drained.  She awoke from anesthesia and transferred in stable condition to the postanesthesia care unit.  Plan: The patient is scheduled to follow-up for bilateral ureteral stent removal  next week.  She will keep this appointment unless she like to undergo a repeat procedure to treat her right renal stone which should be possible now that her ureter is currently undergoing passive dilation.  If so, she will call to cancel this appointment and schedule surgery.  Baruch Gouty, M.D.

## 2017-11-12 NOTE — Anesthesia Procedure Notes (Signed)
Procedure Name: Intubation Date/Time: 11/12/2017 1:25 PM Performed by: Bonney Aid, CRNA Pre-anesthesia Checklist: Patient identified, Emergency Drugs available, Suction available and Patient being monitored Patient Re-evaluated:Patient Re-evaluated prior to induction Oxygen Delivery Method: Circle system utilized Preoxygenation: Pre-oxygenation with 100% oxygen Induction Type: IV induction, Cricoid Pressure applied and Rapid sequence Ventilation: Mask ventilation without difficulty Laryngoscope Size: Mac and 3 Grade View: Grade II Tube type: Oral Number of attempts: 1 Airway Equipment and Method: Stylet and Oral airway Placement Confirmation: ETT inserted through vocal cords under direct vision,  positive ETCO2 and breath sounds checked- equal and bilateral Secured at: 21 cm Tube secured with: Tape Dental Injury: Teeth and Oropharynx as per pre-operative assessment

## 2017-11-12 NOTE — H&P (Signed)
CC: I have kidney stones.  HPI: Kara Fowler is a 47 year-old female patient who was referred by [facility] who is here for renal calculi.  The problem is on the left side. She is not currently having flank pain, back pain, groin pain, nausea, vomiting, fever or chills. She has not caught a stone in her urine strainer since her symptoms began.   She has never had surgical treatment for calculi in the past.   The patient presents for ER follow-up after being diagnosed with 2 distal 6 mm left ureteral calculi in November 2018. She has not been seen for follow-up since that time. She also had a 5 mm right lower pole stone as well as 2 small left stones which we both less than 2 mm. she has been straining her urine for the most part since diagnosis however has not seen a stone pass. She still has intermittent left flank pain that is not as bad as it was when she had her initial presentation to the emergency department. She did pass a stone spontaneously approximately 5 years ago. She has never had stone surgery.     CC/HPI: Pelvic Pain     Patient also has history of chronic pelvic pain syndrome. She did require a hydrodistention with Dr. Logan Bores in 2017. She is currently more concerned about her kidney stones.   She does currently endorse urinary frequency started about the same time she started having her stone symptoms. She does have nocturia 3-4 times per night currently.    ALLERGIES: None   MEDICATIONS: Zyrtec 10 mg capsule 1 capsule PO Daily  Alprazolam 0.25 mg tablet 1 tablet PO Daily  Atorvastatin Calcium 10 mg tablet 1 tablet PO Daily  Nasonex 1 PO Daily  Ondansetron Hcl 8 mg tablet 1 tablet PO Daily  Pantoprazole Sodium 40 mg tablet, delayed release 1 tablet PO Daily  Tylenol 1 PO Daily     GU PSH: Cystoscopy Hydrodistention - about 2017    NON-GU PSH: None   GU PMH: Renal and ureteral calculus    NON-GU PMH: GERD Hypercholesterolemia    FAMILY HISTORY:  Bladder Cancer - Mother Kidney Stones - Father   SOCIAL HISTORY: Marital Status: Married Current Smoking Status: Patient has never smoked.   Tobacco Use Assessment Completed: Used Tobacco in last 30 days? Social Drinker.  Drinks 4+ caffeinated drinks per day. Patient's occupation Public relations account executive.    REVIEW OF SYSTEMS:    GU Review Female:   Patient denies burning /pain with urination, stream starts and stops, get up at night to urinate, trouble starting your stream, leakage of urine, have to strain to urinate, hard to postpone urination, being pregnant, and frequent urination.  Gastrointestinal (Upper):   Patient reports nausea, vomiting, and indigestion/ heartburn.   Gastrointestinal (Lower):   Patient denies diarrhea and constipation.  Constitutional:   Patient reports fatigue. Patient denies fever, night sweats, and weight loss.  Skin:   Patient denies skin rash/ lesion and itching.  Eyes:   Patient denies blurred vision and double vision.  Ears/ Nose/ Throat:   Patient reports sinus problems. Patient denies sore throat.  Hematologic/Lymphatic:   Patient reports easy bruising. Patient denies swollen glands.  Cardiovascular:   Patient denies leg swelling and chest pains.  Respiratory:   Patient denies cough and shortness of breath.  Endocrine:   Patient reports excessive thirst.   Musculoskeletal:   Patient denies back pain and joint pain.  Neurological:   Patient reports  headaches. Patient denies dizziness.  Psychologic:   Patient denies depression and anxiety.   VITAL SIGNS:      11/05/2017 12:47 PM  Weight 155 lb / 70.31 kg  Height 67 in / 170.18 cm  BP 122/82 mmHg  Pulse 65 /min  Temperature 98.2 F / 36.7 C  BMI 24.3 kg/m   MULTI-SYSTEM PHYSICAL EXAMINATION:    Constitutional: Well-nourished. No physical deformities. Normally developed. Good grooming.  Neck: Neck symmetrical, not swollen. Normal tracheal position.  Respiratory: No labored breathing, no use of accessory  muscles.   Cardiovascular: Normal temperature, normal extremity pulses, no swelling, no varicosities.  Lymphatic: No enlargement of neck, axillae, groin.  Skin: No paleness, no jaundice, no cyanosis. No lesion, no ulcer, no rash.  Neurologic / Psychiatric: Oriented to time, oriented to place, oriented to person. No depression, no anxiety, no agitation.  Gastrointestinal: No mass, no tenderness, no rigidity, non obese abdomen.  Eyes: Normal conjunctivae. Normal eyelids.  Ears, Nose, Mouth, and Throat: Left ear no scars, no lesions, no masses. Right ear no scars, no lesions, no masses. Nose no scars, no lesions, no masses. Normal hearing. Normal lips.  Musculoskeletal: Normal gait and station of head and neck.     PAST DATA REVIEWED:  Source Of History:  Patient  X-Ray Review: C.T. Stone Protocol: Reviewed Films. Reviewed Report. Discussed With Patient.     PROCEDURES:         KUB - [id number]  A single view of the abdomen is obtained. KUB today does show probable 6 mm left UVJ stone. There are also phleboliths versus another stone proximal to this one. Known renal stones aren't well visualized due to stool burden.               Urinalysis Dipstick Dipstick Cont'd  Color: Yellow Bilirubin: Neg  Appearance: Clear Ketones: Neg  Specific Gravity: 1.020 Blood: Neg  pH: 6.0 Protein: Neg  Glucose: Neg Urobilinogen: 1.0    Nitrites: Neg    Leukocyte Esterase: Neg    ASSESSMENT:      ICD-10 Details  1 GU:   Renal calculus - N20.0    PLAN:           Orders Labs Urine Culture  X-Rays: KUB          Schedule         Document Letter(s):  Created for Terie PurserSamantha Jackson, PA   Created for Patient: Clinical Summary         Notes:   Since the stone is still present and the patient is symptomatic, she has elected to undergo cystoscopy with bilateral ureteroscopy to treat all of her stone burden.

## 2017-11-12 NOTE — Transfer of Care (Signed)
Immediate Anesthesia Transfer of Care Note  Patient: Kara Fowler  Procedure(s) Performed: CYSTOSCOPY/URETEROSCOPY/HOLMIUM LASER/STENT PLACEMENT (Bilateral )  Patient Location: PACU  Anesthesia Type:General  Level of Consciousness: awake, alert , oriented and patient cooperative  Airway & Oxygen Therapy: Patient Spontanous Breathing and Patient connected to nasal cannula oxygen  Post-op Assessment: Report given to RN and Post -op Vital signs reviewed and stable  Post vital signs: Reviewed and stable  Last Vitals:  Vitals:   11/12/17 1237  BP: (!) 143/82  Pulse: 68  Resp: 18  Temp: 36.7 C  SpO2: 100%    Last Pain:  Vitals:   11/12/17 1257  TempSrc:   PainSc: 8       Patients Stated Pain Goal: 3 (11/12/17 1257)  Complications: No apparent anesthesia complications

## 2017-11-12 NOTE — Interval H&P Note (Signed)
History and Physical Interval Note:  11/12/2017 12:48 PM  Kara Fowler  has presented today for surgery, with the diagnosis of BILATERAL STONES  The various methods of treatment have been discussed with the patient and family. After consideration of risks, benefits and other options for treatment, the patient has consented to  Procedure(s): CYSTOSCOPY/URETEROSCOPY/HOLMIUM LASER/STENT PLACEMENT (Bilateral) as a surgical intervention .  The patient's history has been reviewed, patient examined, no change in status, stable for surgery.  I have reviewed the patient's chart and labs.  Questions were answered to the patient's satisfaction.     Hildred LaserBrian James Flordia Kassem

## 2017-11-12 NOTE — Addendum Note (Signed)
Addended by: Catalina LungerBUDZYN, Aishwarya Shiplett J on: 11/12/2017 12:50 PM   Modules accepted: Orders

## 2017-11-12 NOTE — Discharge Instructions (Signed)
[facility] [phone] Post Ureteroscopy With or Without Stent Instructions  Definitions:  Ureter: The duct that transports urine from the kidney to the bladder. Stent:   A plastic hollow tube that is placed into the ureter, from the kidney to the bladder to prevent the ureter from swelling shut.  GENERAL INSTRUCTIONS:  Despite the fact that no skin incisions were used, the area around the ureter and bladder is raw and irritated. The stent is a foreign body which will further irritate the bladder wall. This irritation is manifested by increased frequency of urination, both day and night, and by an increase in the urge to urinate. In some, the urge to urinate is present almost always. Sometimes the urge is strong enough that you may not be able to stop yourself from urinating. The only real cure is to remove the stent and then give time for the bladder wall to heal which can't be done until the danger of the ureter swelling shut has passed, which varies.  You may see some blood in your urine while the stent is in place and a few days afterwards. Do not be alarmed, even if the urine was clear for a while. Get off your feet and drink lots of fluids until clearing occurs. If you start to pass clots or don't improve, call us.  DIET: You may return to your normal diet immediately. Because of the raw surface of your bladder, alcohol, spicy foods, acid type foods and drinks with caffeine may cause irritation or frequency and should be used in moderation. To keep your urine flowing freely and to avoid constipation, drink plenty of fluids during the day ( 8-10 glasses ). Tip: Avoid cranberry juice because it is very acidic.  ACTIVITY: Your physical activity doesn't need to be restricted. However, if you are very active, you may see some blood in your urine. We suggest that you reduce your activity under these circumstances until the bleeding has stopped.  BOWELS: It is important to  keep your bowels regular during the postoperative period. Straining with bowel movements can cause bleeding. A bowel movement every other day is reasonable. Use a mild laxative if needed, such as Milk of Magnesia 2-3 tablespoons, or 2 Dulcolax tablets. Call if you continue to have problems. If you have been taking narcotics for pain, before, during or after your surgery, you may be constipated. Take a laxative if necessary.   MEDICATION: You should resume your pre-surgery medications unless told not to. In addition you will often be given an antibiotic to prevent infection. These should be taken as prescribed until the bottles are finished unless you are having an unusual reaction to one of the drugs.  PROBLEMS YOU SHOULD REPORT TO [place]:  Fevers over 100.5 Fahrenheit.  Heavy bleeding, or clots ( See above notes about blood in urine ).  Inability to urinate.  Drug reactions ( hives, rash, nausea, vomiting, diarrhea ).  Severe burning or pain with urination that is not improving.  FOLLOW-UP: You will need a follow-up appointment to monitor your progress. Call for this appointment at the number listed above. Usually the first appointment will be about three to fourteen days after your surgery.    Post Anesthesia Home Care Instructions  Activity: Get plenty of rest for the remainder of the day. A responsible individual must stay with you for 24 hours following the procedure.  For the next 24 hours, DO NOT: -Drive a car -Advertising copywriter -Drink alcoholic beverages -Take any medication  unless instructed by your physician -Make any legal decisions or sign important papers.  Meals: Start with liquid foods such as gelatin or soup. Progress to regular foods as tolerated. Avoid greasy, spicy, heavy foods. If nausea and/or vomiting occur, drink only clear liquids until the nausea and/or vomiting subsides. Call your physician if vomiting continues.  Special Instructions/Symptoms: Your throat  may feel dry or sore from the anesthesia or the breathing tube placed in your throat during surgery. If this causes discomfort, gargle with warm salt water. The discomfort should disappear within 24 hours.  If you had a scopolamine patch placed behind your ear for the management of post- operative nausea and/or vomiting:  1. The medication in the patch is effective for 72 hours, after which it should be removed.  Wrap patch in a tissue and discard in the trash. Wash hands thoroughly with soap and water. 2. You may remove the patch earlier than 72 hours if you experience unpleasant side effects which may include dry mouth, dizziness or visual disturbances. 3. Avoid touching the patch. Wash your hands with soap and water after contact with the patch.   No  Advil, Motrin, Ibuprofen, or Aleve before 8 PM.

## 2017-11-13 ENCOUNTER — Encounter (HOSPITAL_BASED_OUTPATIENT_CLINIC_OR_DEPARTMENT_OTHER): Payer: Self-pay | Admitting: Urology

## 2017-11-16 NOTE — Anesthesia Postprocedure Evaluation (Signed)
Anesthesia Post Note  Patient: Kara Fowler  Procedure(s) Performed: CYSTOSCOPY/URETEROSCOPY/HOLMIUM LASER/STENT PLACEMENT (Bilateral )     Patient location during evaluation: PACU Anesthesia Type: General Level of consciousness: awake and alert Pain management: pain level controlled Vital Signs Assessment: post-procedure vital signs reviewed and stable Respiratory status: spontaneous breathing, nonlabored ventilation, respiratory function stable and patient connected to nasal cannula oxygen Cardiovascular status: blood pressure returned to baseline and stable Postop Assessment: no apparent nausea or vomiting Anesthetic complications: no    Last Vitals:  Vitals:   11/12/17 1445 11/12/17 1527  BP: 137/90 137/86  Pulse: 82 82  Resp: 16 16  Temp:  36.7 C  SpO2: 98% 100%    Last Pain:  Vitals:   11/13/17 0910  TempSrc:   PainSc: 7    Pain Goal: Patients Stated Pain Goal: 3 (11/12/17 1257)               Cristela BlueJACKSON,Keyleigh Manninen EDWARD

## 2017-11-19 DIAGNOSIS — N200 Calculus of kidney: Secondary | ICD-10-CM | POA: Diagnosis not present

## 2017-12-02 ENCOUNTER — Ambulatory Visit (INDEPENDENT_AMBULATORY_CARE_PROVIDER_SITE_OTHER): Payer: BLUE CROSS/BLUE SHIELD | Admitting: Internal Medicine

## 2017-12-09 ENCOUNTER — Ambulatory Visit (INDEPENDENT_AMBULATORY_CARE_PROVIDER_SITE_OTHER): Payer: BLUE CROSS/BLUE SHIELD | Admitting: Internal Medicine

## 2017-12-17 DIAGNOSIS — N200 Calculus of kidney: Secondary | ICD-10-CM | POA: Diagnosis not present

## 2017-12-17 DIAGNOSIS — N3281 Overactive bladder: Secondary | ICD-10-CM | POA: Diagnosis not present

## 2017-12-25 DIAGNOSIS — E663 Overweight: Secondary | ICD-10-CM | POA: Diagnosis not present

## 2017-12-25 DIAGNOSIS — Z6826 Body mass index (BMI) 26.0-26.9, adult: Secondary | ICD-10-CM | POA: Diagnosis not present

## 2017-12-25 DIAGNOSIS — S80862A Insect bite (nonvenomous), left lower leg, initial encounter: Secondary | ICD-10-CM | POA: Diagnosis not present

## 2017-12-25 DIAGNOSIS — G629 Polyneuropathy, unspecified: Secondary | ICD-10-CM | POA: Diagnosis not present

## 2017-12-25 DIAGNOSIS — Z1389 Encounter for screening for other disorder: Secondary | ICD-10-CM | POA: Diagnosis not present

## 2017-12-25 DIAGNOSIS — M79605 Pain in left leg: Secondary | ICD-10-CM | POA: Diagnosis not present

## 2017-12-25 DIAGNOSIS — F419 Anxiety disorder, unspecified: Secondary | ICD-10-CM | POA: Diagnosis not present

## 2017-12-29 ENCOUNTER — Other Ambulatory Visit (HOSPITAL_COMMUNITY): Payer: Self-pay | Admitting: Family Medicine

## 2017-12-29 DIAGNOSIS — Z1231 Encounter for screening mammogram for malignant neoplasm of breast: Secondary | ICD-10-CM

## 2018-01-22 ENCOUNTER — Ambulatory Visit (HOSPITAL_COMMUNITY)
Admission: RE | Admit: 2018-01-22 | Discharge: 2018-01-22 | Disposition: A | Discharge status: Home / Self Care | Payer: BLUE CROSS/BLUE SHIELD | Source: Ambulatory Visit | Attending: Family Medicine | Admitting: Family Medicine

## 2018-01-22 ENCOUNTER — Other Ambulatory Visit (HOSPITAL_COMMUNITY): Payer: Self-pay | Admitting: Family Medicine

## 2018-01-22 DIAGNOSIS — R609 Edema, unspecified: Secondary | ICD-10-CM

## 2018-01-22 DIAGNOSIS — E663 Overweight: Secondary | ICD-10-CM | POA: Diagnosis not present

## 2018-01-22 DIAGNOSIS — R600 Localized edema: Secondary | ICD-10-CM | POA: Diagnosis not present

## 2018-01-22 DIAGNOSIS — R229 Localized swelling, mass and lump, unspecified: Secondary | ICD-10-CM

## 2018-01-22 DIAGNOSIS — R2242 Localized swelling, mass and lump, left lower limb: Secondary | ICD-10-CM | POA: Diagnosis not present

## 2018-01-22 DIAGNOSIS — Z6826 Body mass index (BMI) 26.0-26.9, adult: Secondary | ICD-10-CM | POA: Diagnosis not present

## 2018-01-22 DIAGNOSIS — IMO0002 Reserved for concepts with insufficient information to code with codable children: Secondary | ICD-10-CM

## 2018-01-22 DIAGNOSIS — T07XXXA Unspecified multiple injuries, initial encounter: Secondary | ICD-10-CM | POA: Diagnosis not present

## 2018-03-17 DIAGNOSIS — N200 Calculus of kidney: Secondary | ICD-10-CM | POA: Diagnosis not present

## 2018-04-14 DIAGNOSIS — J040 Acute laryngitis: Secondary | ICD-10-CM | POA: Diagnosis not present

## 2018-04-14 DIAGNOSIS — R51 Headache: Secondary | ICD-10-CM | POA: Diagnosis not present

## 2018-04-14 DIAGNOSIS — Z1389 Encounter for screening for other disorder: Secondary | ICD-10-CM | POA: Diagnosis not present

## 2018-04-14 DIAGNOSIS — J019 Acute sinusitis, unspecified: Secondary | ICD-10-CM | POA: Diagnosis not present

## 2018-04-14 DIAGNOSIS — J302 Other seasonal allergic rhinitis: Secondary | ICD-10-CM | POA: Diagnosis not present

## 2018-04-14 DIAGNOSIS — Z6825 Body mass index (BMI) 25.0-25.9, adult: Secondary | ICD-10-CM | POA: Diagnosis not present

## 2018-04-14 DIAGNOSIS — R05 Cough: Secondary | ICD-10-CM | POA: Diagnosis not present

## 2018-04-14 DIAGNOSIS — B001 Herpesviral vesicular dermatitis: Secondary | ICD-10-CM | POA: Diagnosis not present

## 2018-05-19 ENCOUNTER — Encounter (INDEPENDENT_AMBULATORY_CARE_PROVIDER_SITE_OTHER): Payer: Self-pay | Admitting: Internal Medicine

## 2018-05-19 ENCOUNTER — Ambulatory Visit (INDEPENDENT_AMBULATORY_CARE_PROVIDER_SITE_OTHER): Payer: BLUE CROSS/BLUE SHIELD | Admitting: Internal Medicine

## 2018-05-19 VITALS — BP 130/80 | HR 72 | Temp 98.1°F | Ht 67.0 in | Wt 162.1 lb

## 2018-05-19 DIAGNOSIS — R112 Nausea with vomiting, unspecified: Secondary | ICD-10-CM

## 2018-05-19 DIAGNOSIS — K219 Gastro-esophageal reflux disease without esophagitis: Secondary | ICD-10-CM

## 2018-05-19 DIAGNOSIS — N3281 Overactive bladder: Secondary | ICD-10-CM | POA: Diagnosis not present

## 2018-05-19 MED ORDER — ONDANSETRON HCL 4 MG PO TABS: 4.0000 mg | ORAL_TABLET | Freq: Three times a day (TID) | ORAL | 1 refills | Status: DC | PRN

## 2018-05-19 MED ORDER — PANTOPRAZOLE SODIUM 40 MG PO TBEC: 40.0000 mg | DELAYED_RELEASE_TABLET | Freq: Every day | ORAL | 5 refills | Status: DC

## 2018-05-19 NOTE — Progress Notes (Signed)
   Subjective:    Patient ID: Kara Fowler, female    DOB: 02/16/1971, 47 y.o.   MRN: [id number]  HPI Here today with c/o GERD. Last seen in October 2017 by Dr. Karilyn Cotaehman.  Has been off the Protonix for a couple of months. She says her acid reflux worse without the Protonix.  She says she is Lactulose intolerant. She tries to watch her diet. She has a BM daily.No melena or BRRB.  Her appetite is not good because of the GERD. Sometimes she does have nausea with vomiting. EGD in July of 2017 for N,V, GERD. Multiple gastric polyps. Normal esophagus and stomach.   Review of Systems    Past Medical History:  Diagnosis Date  . GERD (gastroesophageal reflux disease) 04/15/2016  . Headache    sinus  . History of kidney stones   . Peptic ulcer   . PONV (postoperative nausea and vomiting)   . Sinusitis     Past Surgical History:  Procedure Laterality Date  . APPENDECTOMY    . BIOPSY  05/17/2016   Procedure: BIOPSY;  Surgeon: Malissa HippoNajeeb U Rehman, MD;  Location: [facility];  Service: Endoscopy;;  gastric, duodenum  . bladder stretching  2017   dr Logan Boresevans  . CYSTOSCOPY/URETEROSCOPY/HOLMIUM LASER/STENT PLACEMENT Bilateral 11/12/2017   Procedure: CYSTOSCOPY/URETEROSCOPY/HOLMIUM LASER/STENT PLACEMENT;  Surgeon: Hildred LaserBudzyn, Brian James, MD;  [facility];  Service: Urology;  Laterality: Bilateral;  . ESOPHAGOGASTRODUODENOSCOPY N/A 05/17/2016   Procedure: ESOPHAGOGASTRODUODENOSCOPY (EGD);  Surgeon: Malissa HippoNajeeb U Rehman, MD;  Location: [facility];  Service: Endoscopy;  Laterality: N/A;  2:00 - moved to 10:30 - office to notify    No Known Allergies  Current Outpatient Medications on File Prior to Visit  Medication Sig Dispense Refill  . ALPRAZolam (XANAX) 0.25 MG tablet Take 0.25 mg by mouth as needed for anxiety.    . cetirizine (ZYRTEC) 10 MG tablet Take 10 mg by mouth daily.    . Mometasone Furoate 100 MCG/ACT AERO Inhale into the lungs.    . Multiple Vitamins-Minerals (CENTRUM  ADULTS PO) Take by mouth.    . NON FORMULARY Myrbetriq 25mg  one a day     No current facility-administered medications on file prior to visit.      Objective:   Physical Exam Blood pressure 130/80, pulse 72, temperature 98.1 F (36.7 C), height 5\' 7"  (1.702 m), weight 162 lb 1.6 oz (73.5 kg), unknown if currently breastfeeding. Alert and oriented. Skin warm and dry. Oral mucosa is moist.   . Sclera anicteric, conjunctivae is pink. Thyroid not enlarged. No cervical lymphadenopathy. Lungs clear. Heart regular rate and rhythm.  Abdomen is soft. Bowel sounds are positive. No hepatomegaly. No abdominal masses felt. No tenderness.  No edema to lower extremities.             Assessment & Plan:  GERD. Rx for Protonix sent to her pharmacy. Nausea/vomiting: Rx for Zofran sent to her pharmacy. Gastroparesis needs to be ruled out. Gastric emptying study

## 2018-05-19 NOTE — Patient Instructions (Signed)
Rx for Zofran and Protonix sent to her pharmacy

## 2018-05-20 DIAGNOSIS — C44311 Basal cell carcinoma of skin of nose: Secondary | ICD-10-CM | POA: Diagnosis not present

## 2018-05-20 DIAGNOSIS — D1801 Hemangioma of skin and subcutaneous tissue: Secondary | ICD-10-CM | POA: Diagnosis not present

## 2018-05-20 DIAGNOSIS — D0439 Carcinoma in situ of skin of other parts of face: Secondary | ICD-10-CM | POA: Diagnosis not present

## 2018-05-20 DIAGNOSIS — D485 Neoplasm of uncertain behavior of skin: Secondary | ICD-10-CM | POA: Diagnosis not present

## 2018-05-21 DIAGNOSIS — E663 Overweight: Secondary | ICD-10-CM | POA: Diagnosis not present

## 2018-05-21 DIAGNOSIS — Z0000 Encounter for general adult medical examination without abnormal findings: Secondary | ICD-10-CM | POA: Diagnosis not present

## 2018-05-21 DIAGNOSIS — Z6826 Body mass index (BMI) 26.0-26.9, adult: Secondary | ICD-10-CM | POA: Diagnosis not present

## 2018-05-21 DIAGNOSIS — Z1389 Encounter for screening for other disorder: Secondary | ICD-10-CM | POA: Diagnosis not present

## 2018-05-26 ENCOUNTER — Encounter (HOSPITAL_COMMUNITY): Payer: BLUE CROSS/BLUE SHIELD

## 2018-06-09 ENCOUNTER — Telehealth (INDEPENDENT_AMBULATORY_CARE_PROVIDER_SITE_OTHER): Payer: Self-pay | Admitting: Internal Medicine

## 2018-06-09 NOTE — Telephone Encounter (Signed)
Message left on her phone to return call. She left a telephone message.

## 2018-06-18 DIAGNOSIS — F419 Anxiety disorder, unspecified: Secondary | ICD-10-CM | POA: Diagnosis not present

## 2018-06-18 DIAGNOSIS — Z87442 Personal history of urinary calculi: Secondary | ICD-10-CM | POA: Diagnosis not present

## 2018-06-18 DIAGNOSIS — Z6826 Body mass index (BMI) 26.0-26.9, adult: Secondary | ICD-10-CM | POA: Diagnosis not present

## 2018-06-18 DIAGNOSIS — M545 Low back pain: Secondary | ICD-10-CM | POA: Diagnosis not present

## 2018-06-18 DIAGNOSIS — R42 Dizziness and giddiness: Secondary | ICD-10-CM | POA: Diagnosis not present

## 2018-06-18 DIAGNOSIS — E663 Overweight: Secondary | ICD-10-CM | POA: Diagnosis not present

## 2018-06-18 DIAGNOSIS — Z1389 Encounter for screening for other disorder: Secondary | ICD-10-CM | POA: Diagnosis not present

## 2018-06-18 DIAGNOSIS — R002 Palpitations: Secondary | ICD-10-CM | POA: Diagnosis not present

## 2018-07-13 DIAGNOSIS — C44311 Basal cell carcinoma of skin of nose: Secondary | ICD-10-CM | POA: Diagnosis not present

## 2018-07-27 DIAGNOSIS — D0439 Carcinoma in situ of skin of other parts of face: Secondary | ICD-10-CM | POA: Diagnosis not present

## 2018-08-04 DIAGNOSIS — N3281 Overactive bladder: Secondary | ICD-10-CM | POA: Diagnosis not present

## 2018-08-04 DIAGNOSIS — R102 Pelvic and perineal pain: Secondary | ICD-10-CM | POA: Diagnosis not present

## 2018-08-04 DIAGNOSIS — N200 Calculus of kidney: Secondary | ICD-10-CM | POA: Diagnosis not present

## 2018-10-26 DIAGNOSIS — J019 Acute sinusitis, unspecified: Secondary | ICD-10-CM | POA: Diagnosis not present

## 2018-10-26 DIAGNOSIS — Z1389 Encounter for screening for other disorder: Secondary | ICD-10-CM | POA: Diagnosis not present

## 2018-10-26 DIAGNOSIS — J301 Allergic rhinitis due to pollen: Secondary | ICD-10-CM | POA: Diagnosis not present

## 2018-10-26 DIAGNOSIS — E663 Overweight: Secondary | ICD-10-CM | POA: Diagnosis not present

## 2018-10-26 DIAGNOSIS — F419 Anxiety disorder, unspecified: Secondary | ICD-10-CM | POA: Diagnosis not present

## 2018-10-26 DIAGNOSIS — Z6826 Body mass index (BMI) 26.0-26.9, adult: Secondary | ICD-10-CM | POA: Diagnosis not present

## 2018-12-15 ENCOUNTER — Ambulatory Visit (INDEPENDENT_AMBULATORY_CARE_PROVIDER_SITE_OTHER): Payer: BLUE CROSS/BLUE SHIELD | Admitting: Internal Medicine

## 2018-12-15 ENCOUNTER — Encounter (INDEPENDENT_AMBULATORY_CARE_PROVIDER_SITE_OTHER): Payer: Self-pay | Admitting: Internal Medicine

## 2018-12-15 VITALS — BP 120/83 | HR 74 | Temp 98.2°F | Ht 67.0 in | Wt 166.8 lb

## 2018-12-15 DIAGNOSIS — R112 Nausea with vomiting, unspecified: Secondary | ICD-10-CM | POA: Diagnosis not present

## 2018-12-15 DIAGNOSIS — K219 Gastro-esophageal reflux disease without esophagitis: Secondary | ICD-10-CM

## 2018-12-15 MED ORDER — ONDANSETRON HCL 4 MG PO TABS: 4.0000 mg | ORAL_TABLET | Freq: Three times a day (TID) | ORAL | 1 refills | Status: DC | PRN

## 2018-12-15 MED ORDER — PANTOPRAZOLE SODIUM 40 MG PO TBEC: 40.0000 mg | DELAYED_RELEASE_TABLET | Freq: Every day | ORAL | 11 refills | Status: DC

## 2018-12-15 NOTE — Progress Notes (Signed)
   Subjective:    Patient ID: Kara Fowler, female    DOB: Jun 24, 1971, 48 y.o.   MRN: [id number]  HPI  Here today for f/u. Last seen in July of 2019. Hx of chronic GERD. Maintained on Protonix, but has been out. She tells me she is doing okay. Has been out of Protonix so has been out of the Protonix. I have called an RX for a year. Her appetite is good.  No weight loss. BMs are normal.     EGD in July of 2017 for N,V, GERD. Multiple gastric polyps. Normal esophagus and stomach.  Review of Systems Past Medical History:  Diagnosis Date  . GERD (gastroesophageal reflux disease) 04/15/2016  . Headache    sinus  . History of kidney stones   . Peptic ulcer   . PONV (postoperative nausea and vomiting)   . Sinusitis     Past Surgical History:  Procedure Laterality Date  . APPENDECTOMY    . BIOPSY  05/17/2016   Procedure: BIOPSY;  Surgeon: Malissa Hippo, MD;  [facility]: [facility];  Service: Endoscopy;;  gastric, duodenum  . bladder stretching  2017   dr Logan Bores  . CYSTOSCOPY/URETEROSCOPY/HOLMIUM LASER/STENT PLACEMENT Bilateral 11/12/2017   Procedure: CYSTOSCOPY/URETEROSCOPY/HOLMIUM LASER/STENT PLACEMENT;  Surgeon: Hildred Laser, MD;  [facility];  Service: Urology;  Laterality: Bilateral;  . ESOPHAGOGASTRODUODENOSCOPY N/A 05/17/2016   Procedure: ESOPHAGOGASTRODUODENOSCOPY (EGD);  Surgeon: Malissa Hippo, MD;  [facility];  Service: Endoscopy;  Laterality: N/A;  2:00 - moved to 10:30 - office to notify    No Known Allergies  Current Outpatient Medications on File Prior to Visit  Medication Sig Dispense Refill  . ALPRAZolam (XANAX) 0.25 MG tablet Take 0.25 mg by mouth as needed for anxiety.    . cetirizine (ZYRTEC) 10 MG tablet Take 10 mg by mouth daily.    . Mometasone Furoate 100 MCG/ACT AERO Inhale into the lungs.    . Multiple Vitamins-Minerals (CENTRUM ADULTS PO) Take by mouth.     No current facility-administered medications on file  prior to visit.         Objective:   Physical Exam Blood pressure 120/83, pulse 74, temperature 98.2 F (36.8 C), height 5\' 7"  (1.702 m), weight 166 lb 12.8 oz (75.7 kg), unknown if currently breastfeeding. Alert and oriented. Skin warm and dry. Oral mucosa is moist.   . Sclera anicteric, conjunctivae is pink. Thyroid not enlarged. No cervical lymphadenopathy. Lungs clear. Heart regular rate and rhythm.  Abdomen is soft. Bowel sounds are positive. No hepatomegaly. No abdominal masses felt. No tenderness.  No edema to lower extremities.          Assessment & Plan:  GERD. She will continue the Protonix.  OV in 1 year

## 2018-12-15 NOTE — Patient Instructions (Signed)
Rx for Protonix and Zofran sent to her pharmacy. OV in 1 year.

## 2019-02-25 DIAGNOSIS — F419 Anxiety disorder, unspecified: Secondary | ICD-10-CM | POA: Diagnosis not present

## 2019-02-25 DIAGNOSIS — G64 Other disorders of peripheral nervous system: Secondary | ICD-10-CM | POA: Diagnosis not present

## 2019-06-17 ENCOUNTER — Encounter: Payer: Self-pay | Admitting: Gastroenterology

## 2019-06-17 DIAGNOSIS — Z0001 Encounter for general adult medical examination with abnormal findings: Secondary | ICD-10-CM | POA: Diagnosis not present

## 2019-06-17 DIAGNOSIS — R300 Dysuria: Secondary | ICD-10-CM | POA: Diagnosis not present

## 2019-06-17 DIAGNOSIS — F419 Anxiety disorder, unspecified: Secondary | ICD-10-CM | POA: Diagnosis not present

## 2019-06-17 DIAGNOSIS — Z6826 Body mass index (BMI) 26.0-26.9, adult: Secondary | ICD-10-CM | POA: Diagnosis not present

## 2019-06-17 DIAGNOSIS — E663 Overweight: Secondary | ICD-10-CM | POA: Diagnosis not present

## 2019-06-17 DIAGNOSIS — N3281 Overactive bladder: Secondary | ICD-10-CM | POA: Diagnosis not present

## 2019-07-22 DIAGNOSIS — Z13220 Encounter for screening for lipoid disorders: Secondary | ICD-10-CM | POA: Diagnosis not present

## 2019-07-22 DIAGNOSIS — Z01419 Encounter for gynecological examination (general) (routine) without abnormal findings: Secondary | ICD-10-CM | POA: Diagnosis not present

## 2019-07-22 DIAGNOSIS — Z6825 Body mass index (BMI) 25.0-25.9, adult: Secondary | ICD-10-CM | POA: Diagnosis not present

## 2019-07-22 DIAGNOSIS — Z131 Encounter for screening for diabetes mellitus: Secondary | ICD-10-CM | POA: Diagnosis not present

## 2019-07-22 DIAGNOSIS — Z1329 Encounter for screening for other suspected endocrine disorder: Secondary | ICD-10-CM | POA: Diagnosis not present

## 2019-10-21 ENCOUNTER — Other Ambulatory Visit: Payer: Self-pay

## 2019-10-21 ENCOUNTER — Ambulatory Visit: Payer: BC Managed Care – PPO | Attending: Internal Medicine

## 2019-10-21 DIAGNOSIS — Z20828 Contact with and (suspected) exposure to other viral communicable diseases: Secondary | ICD-10-CM | POA: Diagnosis not present

## 2019-10-21 DIAGNOSIS — Z20822 Contact with and (suspected) exposure to covid-19: Secondary | ICD-10-CM

## 2019-10-23 LAB — NOVEL CORONAVIRUS, NAA: SARS-CoV-2, NAA: NOT DETECTED

## 2019-10-29 ENCOUNTER — Other Ambulatory Visit: Payer: Self-pay

## 2019-10-29 ENCOUNTER — Ambulatory Visit: Payer: BC Managed Care – PPO | Attending: Internal Medicine

## 2019-10-29 DIAGNOSIS — Z20822 Contact with and (suspected) exposure to covid-19: Secondary | ICD-10-CM

## 2019-10-30 LAB — NOVEL CORONAVIRUS, NAA: SARS-CoV-2, NAA: NOT DETECTED

## 2019-11-10 ENCOUNTER — Other Ambulatory Visit (HOSPITAL_COMMUNITY): Payer: Self-pay | Admitting: Family Medicine

## 2019-11-10 DIAGNOSIS — Z1231 Encounter for screening mammogram for malignant neoplasm of breast: Secondary | ICD-10-CM

## 2019-11-24 ENCOUNTER — Other Ambulatory Visit: Payer: Self-pay

## 2019-11-24 ENCOUNTER — Ambulatory Visit (HOSPITAL_COMMUNITY)
Admission: RE | Admit: 2019-11-24 | Discharge: 2019-11-24 | Disposition: A | Discharge status: Home / Self Care | Payer: BC Managed Care – PPO | Source: Ambulatory Visit | Attending: Family Medicine | Admitting: Family Medicine

## 2019-11-24 DIAGNOSIS — Z1231 Encounter for screening mammogram for malignant neoplasm of breast: Secondary | ICD-10-CM | POA: Diagnosis not present

## 2019-11-25 ENCOUNTER — Other Ambulatory Visit (HOSPITAL_COMMUNITY): Payer: Self-pay | Admitting: Family Medicine

## 2019-11-25 DIAGNOSIS — N63 Unspecified lump in unspecified breast: Secondary | ICD-10-CM

## 2019-12-10 ENCOUNTER — Other Ambulatory Visit: Payer: Self-pay

## 2019-12-10 ENCOUNTER — Ambulatory Visit
Admission: RE | Admit: 2019-12-10 | Discharge: 2019-12-10 | Disposition: A | Discharge status: Home / Self Care | Payer: BC Managed Care – PPO | Source: Ambulatory Visit | Attending: Family Medicine | Admitting: Family Medicine

## 2019-12-10 ENCOUNTER — Ambulatory Visit: Payer: BC Managed Care – PPO

## 2019-12-10 DIAGNOSIS — R922 Inconclusive mammogram: Secondary | ICD-10-CM | POA: Diagnosis not present

## 2019-12-10 DIAGNOSIS — N63 Unspecified lump in unspecified breast: Secondary | ICD-10-CM

## 2019-12-16 ENCOUNTER — Ambulatory Visit (INDEPENDENT_AMBULATORY_CARE_PROVIDER_SITE_OTHER): Payer: BLUE CROSS/BLUE SHIELD | Admitting: Nurse Practitioner

## 2019-12-29 ENCOUNTER — Other Ambulatory Visit (INDEPENDENT_AMBULATORY_CARE_PROVIDER_SITE_OTHER): Payer: Self-pay | Admitting: *Deleted

## 2019-12-29 DIAGNOSIS — K219 Gastro-esophageal reflux disease without esophagitis: Secondary | ICD-10-CM

## 2019-12-29 MED ORDER — PANTOPRAZOLE SODIUM 40 MG PO TBEC: 40.0000 mg | DELAYED_RELEASE_TABLET | Freq: Every day | ORAL | 3 refills | Status: DC

## 2020-01-06 DIAGNOSIS — Z23 Encounter for immunization: Secondary | ICD-10-CM | POA: Diagnosis not present

## 2020-01-13 DIAGNOSIS — S233XXA Sprain of ligaments of thoracic spine, initial encounter: Secondary | ICD-10-CM | POA: Diagnosis not present

## 2020-01-13 DIAGNOSIS — S134XXA Sprain of ligaments of cervical spine, initial encounter: Secondary | ICD-10-CM | POA: Diagnosis not present

## 2020-01-17 DIAGNOSIS — S134XXA Sprain of ligaments of cervical spine, initial encounter: Secondary | ICD-10-CM | POA: Diagnosis not present

## 2020-01-17 DIAGNOSIS — S233XXA Sprain of ligaments of thoracic spine, initial encounter: Secondary | ICD-10-CM | POA: Diagnosis not present

## 2020-01-20 DIAGNOSIS — S134XXA Sprain of ligaments of cervical spine, initial encounter: Secondary | ICD-10-CM | POA: Diagnosis not present

## 2020-01-20 DIAGNOSIS — S233XXA Sprain of ligaments of thoracic spine, initial encounter: Secondary | ICD-10-CM | POA: Diagnosis not present

## 2020-02-02 DIAGNOSIS — Z23 Encounter for immunization: Secondary | ICD-10-CM | POA: Diagnosis not present

## 2020-02-28 ENCOUNTER — Ambulatory Visit (INDEPENDENT_AMBULATORY_CARE_PROVIDER_SITE_OTHER): Payer: BLUE CROSS/BLUE SHIELD | Admitting: Gastroenterology

## 2020-03-10 DIAGNOSIS — R358 Other polyuria: Secondary | ICD-10-CM | POA: Diagnosis not present

## 2020-03-10 DIAGNOSIS — E663 Overweight: Secondary | ICD-10-CM | POA: Diagnosis not present

## 2020-03-10 DIAGNOSIS — Z1389 Encounter for screening for other disorder: Secondary | ICD-10-CM | POA: Diagnosis not present

## 2020-03-10 DIAGNOSIS — R5383 Other fatigue: Secondary | ICD-10-CM | POA: Diagnosis not present

## 2020-03-10 DIAGNOSIS — Z6826 Body mass index (BMI) 26.0-26.9, adult: Secondary | ICD-10-CM | POA: Diagnosis not present

## 2020-03-10 DIAGNOSIS — R682 Dry mouth, unspecified: Secondary | ICD-10-CM | POA: Diagnosis not present

## 2020-03-13 ENCOUNTER — Encounter (INDEPENDENT_AMBULATORY_CARE_PROVIDER_SITE_OTHER): Payer: Self-pay | Admitting: *Deleted

## 2020-03-13 ENCOUNTER — Other Ambulatory Visit: Payer: Self-pay

## 2020-03-13 ENCOUNTER — Telehealth (INDEPENDENT_AMBULATORY_CARE_PROVIDER_SITE_OTHER): Payer: Self-pay | Admitting: *Deleted

## 2020-03-13 ENCOUNTER — Ambulatory Visit (INDEPENDENT_AMBULATORY_CARE_PROVIDER_SITE_OTHER): Payer: BC Managed Care – PPO | Admitting: Gastroenterology

## 2020-03-13 ENCOUNTER — Other Ambulatory Visit (INDEPENDENT_AMBULATORY_CARE_PROVIDER_SITE_OTHER): Payer: Self-pay | Admitting: *Deleted

## 2020-03-13 ENCOUNTER — Encounter (INDEPENDENT_AMBULATORY_CARE_PROVIDER_SITE_OTHER): Payer: Self-pay | Admitting: Gastroenterology

## 2020-03-13 VITALS — BP 124/83 | HR 75 | Temp 97.0°F | Ht 67.0 in | Wt 171.1 lb

## 2020-03-13 DIAGNOSIS — K219 Gastro-esophageal reflux disease without esophagitis: Secondary | ICD-10-CM | POA: Diagnosis not present

## 2020-03-13 DIAGNOSIS — R1030 Lower abdominal pain, unspecified: Secondary | ICD-10-CM | POA: Diagnosis not present

## 2020-03-13 DIAGNOSIS — Z1211 Encounter for screening for malignant neoplasm of colon: Secondary | ICD-10-CM

## 2020-03-13 DIAGNOSIS — R112 Nausea with vomiting, unspecified: Secondary | ICD-10-CM | POA: Diagnosis not present

## 2020-03-13 MED ORDER — HYOSCYAMINE SULFATE 0.125 MG SL SUBL
0.1250 mg | SUBLINGUAL_TABLET | Freq: Four times a day (QID) | SUBLINGUAL | 1 refills | Status: AC | PRN
Start: 2020-03-13 — End: ?

## 2020-03-13 MED ORDER — PANTOPRAZOLE SODIUM 40 MG PO TBEC: 40.0000 mg | DELAYED_RELEASE_TABLET | Freq: Every day | ORAL | 3 refills | Status: DC

## 2020-03-13 MED ORDER — ONDANSETRON HCL 4 MG PO TABS: 4.0000 mg | ORAL_TABLET | Freq: Three times a day (TID) | ORAL | 3 refills | Status: DC | PRN

## 2020-03-13 NOTE — Patient Instructions (Signed)
Can try levsin as needed for abdominal pain. Continue reflux diet modifications. Make sure to take celebrex with food. We are arranging colonoscopy

## 2020-03-13 NOTE — Progress Notes (Signed)
r  Patient profile: Kara Fowler is a 49 y.o. female seen for evaluation of GERD. Last seen in clinic 11/2018  History of Present Illness: Kara Fowler is seen today for yearly follow-up.  She has been followed for chronic GERD and nausea since 2017 when she had an endoscopy that was unremarkable.  She reports symptoms have been very stable and drastically improved at this point compared to 2017.  She takes pantoprazole each morning with occasional Zofran a few times a week as needed.  She is compliant with diet modifications and finds acidic foods aggravate symptoms.  She is trying to decrease caffeine.  Alcohol once a week.  Non-smoker.  She has no dysphagia or upper abdominal pain.  She does complain today of some lower abdominal pain that is been ongoing about a year, occurs most days but can be intermittent in intensity.  She has seen urology and GYN as well.  She does not take NSAIDs but feels Tylenol helps the pain.  Pain is described as a burning or stabbing in her lower abdomen, does not relate to eating but can improve slightly at times after defecation.  Bowel movements can be variable in consistency from soft to loose.  She does not eat dairy products but she is planning to start a diet avoiding gluten to see if helps abdominal pain.  Her weight is up as below. Reports chronic urology problems and has urine studies pending collected last week.   Wt Readings from Last 3 Encounters:  03/13/20 171 lb 1.6 oz (77.6 kg)  12/15/18 166 lb 12.8 oz (75.7 kg)  05/19/18 162 lb 1.6 oz (73.5 kg)      Last Endoscopy: 2017-EGD 2017-regular Z-line, 2 cm hiatal hernia, multiple polyps gastric fundus, otherwise normal, pathology unremarkable   Past Medical History:  Past Medical History:  Diagnosis Date  . GERD (gastroesophageal reflux disease) 04/15/2016  . Headache    sinus  . History of kidney stones   . Peptic ulcer   . PONV (postoperative nausea and vomiting)   . Sinusitis       Problem List: Patient Active Problem List   Diagnosis Date Noted  . GERD (gastroesophageal reflux disease) 04/15/2016    Past Surgical History: Past Surgical History:  Procedure Laterality Date  . APPENDECTOMY    . BIOPSY  05/17/2016   Procedure: BIOPSY;  Surgeon: Malissa Hippo, MD;  [facility];  Service: Endoscopy;;  gastric, duodenum  . bladder stretching  2017   dr Logan Bores  . CYSTOSCOPY/URETEROSCOPY/HOLMIUM LASER/STENT PLACEMENT Bilateral 11/12/2017   Procedure: CYSTOSCOPY/URETEROSCOPY/HOLMIUM LASER/STENT PLACEMENT;  Surgeon: Hildred Laser, MD;  Location: [facility];  Service: Urology;  Laterality: Bilateral;  . ESOPHAGOGASTRODUODENOSCOPY N/A 05/17/2016   Procedure: ESOPHAGOGASTRODUODENOSCOPY (EGD);  Surgeon: Malissa Hippo, MD;  [facility];  Service: Endoscopy;  Laterality: N/A;  2:00 - moved to 10:30 - office to notify    Allergies: No Known Allergies    Home Medications:  Current Outpatient Medications:  .  ALPRAZolam (XANAX) 0.25 MG tablet, Take 0.5 mg by mouth as needed for anxiety. , Disp: , Rfl:  .  atorvastatin (LIPITOR) 10 MG tablet, Take 10 mg by mouth daily., Disp: , Rfl:  .  cetirizine (ZYRTEC) 10 MG tablet, Take 10 mg by mouth daily., Disp: , Rfl:  .  Multiple Vitamins-Minerals (CENTRUM ADULTS PO), Take by mouth daily. , Disp: , Rfl:  .  ondansetron (ZOFRAN) 4 MG tablet, Take 1 tablet (4  mg total) by mouth every 8 (eight) hours as needed for nausea or vomiting., Disp: 30 tablet, Rfl: 3 .  OVER THE COUNTER MEDICATION, Vitamin D3 2000 units - patient takes 1 by mouth daily., Disp: , Rfl:  .  OVER THE COUNTER MEDICATION, Cal - Mag Citrate 2:1 ratio - patient takes 1 a day., Disp: , Rfl:  .  OVER THE COUNTER MEDICATION, Bladder Health -  Patient takes 1 by mouth daily., Disp: , Rfl:  .  pantoprazole (PROTONIX) 40 MG tablet, Take 1 tablet (40 mg total) by mouth daily., Disp: 90 tablet, Rfl: 3 .  zolpidem (AMBIEN) 10 MG  tablet, Take 10 mg by mouth at bedtime as needed for sleep., Disp: , Rfl:  .  celecoxib (CELEBREX) 200 MG capsule, Take 200 mg by mouth. Patient is to start today- 03/13/20: She will begin taking one by mouth for 5 days, then off 2 days., Disp: , Rfl:  .  hyoscyamine (LEVSIN SL) 0.125 MG SL tablet, Place 1 tablet (0.125 mg total) under the tongue every 6 (six) hours as needed (abd pain)., Disp: 30 tablet, Rfl: 1   Family History: Denies family hx colon polyps or colon cancer.  Reports maternal grandmother had bladder cancer.  Social History:   reports that she has never smoked. She has never used smokeless tobacco. She reports current alcohol use. She reports that she does not use drugs.   Review of Systems: Constitutional: Denies weight loss/weight gain  Eyes: No changes in vision. ENT: No oral lesions, sore throat.  GI: see HPI.  Heme/Lymph: No easy bruising.  CV: No chest pain.  GU: No hematuria.  Integumentary: No rashes.  Neuro: No headaches.  Psych: No depression/anxiety.  Endocrine: No heat/cold intolerance.  Allergic/Immunologic: No urticaria.  Resp: No cough, SOB.  Musculoskeletal: No joint swelling.    Physical Examination: BP 124/83 (BP Location: Right Arm, Patient Position: Sitting, Cuff Size: Large)   Pulse 75   Temp (!) 97 F (36.1 C) (Temporal)   Ht 5\' 7"  (1.702 m)   Wt 171 lb 1.6 oz (77.6 kg)   BMI 26.80 kg/m  Gen: NAD, alert and oriented x 4 HEENT: PEERLA, EOMI, Neck: supple, no JVD Chest: CTA bilaterally, no wheezes, crackles, or other adventitious sounds CV: RRR, no m/g/c/r Abd: soft, nontender in lower abdomen-reports pain in suprapubic, ND, +BS in all four quadrants; no HSM, guarding, ridigity, or rebound tenderness Ext: no edema, well perfused with 2+ pulses, Skin: no rash or lesions noted on observed skin Lymph: no noted LAD  Data Reviewed:  She had labs drawn and urine collected 4 days ago at [facility] we sent a request to have  these faxed to our office when available   Assessment/Plan: Ms. Elling is a 49 y.o. female   1.  GERD-well-controlled on pantoprazole 40 mg, compliant with diet modifications.  Planning to start Celebrex for back pain, reviewed to take with food.  EGD 2017 without Barrett's or H. pylori, given symptom chronicity will hold off on screening further.  Continue diet modifications  2.  Colon cancer screening-due based on age.  None prior. denies issues previously with sedation.  3.  Lower abdominal pain-reports chronic bladder issues, differential would also include irritable bowel, pain improved with Tylenol.  Will give antispasmodic to try as needed.  Labs and urine pending.  Patient denies CP, SOB, and use of blood thinners. I discussed the risks and benefits of procedure including bleeding, perforation, infection, missed lesions, medication reactions  and possible hospitalization or surgery if complications. All questions answered.    Ellanor was seen today for follow-up.  Diagnoses and all orders for this visit:  Chronic GERD -     ondansetron (ZOFRAN) 4 MG tablet; Take 1 tablet (4 mg total) by mouth every 8 (eight) hours as needed for nausea or vomiting.  Colon cancer screening  Gastroesophageal reflux disease without esophagitis -     pantoprazole (PROTONIX) 40 MG tablet; Take 1 tablet (40 mg total) by mouth daily.  Nausea and vomiting, intractability of vomiting not specified, unspecified vomiting type -     ondansetron (ZOFRAN) 4 MG tablet; Take 1 tablet (4 mg total) by mouth every 8 (eight) hours as needed for nausea or vomiting.  Lower abdominal pain  Other orders -     hyoscyamine (LEVSIN SL) 0.125 MG SL tablet; Place 1 tablet (0.125 mg total) under the tongue every 6 (six) hours as needed (abd pain).     Recommendations:   I personally performed the service, non-incident to. (WP)  Tawni Pummel, [facility]

## 2020-03-13 NOTE — Telephone Encounter (Signed)
Patient needs Sutab (copay card) ° °

## 2020-03-14 MED ORDER — SUTAB 1479-225-188 MG PO TABS: 1.0000 | ORAL_TABLET | Freq: Once | ORAL | 0 refills | Status: AC

## 2020-03-27 ENCOUNTER — Other Ambulatory Visit (HOSPITAL_COMMUNITY): Payer: BC Managed Care – PPO

## 2020-03-29 ENCOUNTER — Ambulatory Visit (HOSPITAL_COMMUNITY)
Admission: RE | Admit: 2020-03-29 | Payer: BC Managed Care – PPO | Source: Home / Self Care | Admitting: Internal Medicine

## 2020-03-29 ENCOUNTER — Encounter (HOSPITAL_COMMUNITY): Admission: RE | Payer: Self-pay | Source: Home / Self Care

## 2020-03-29 SURGERY — COLONOSCOPY
Anesthesia: Moderate Sedation

## 2020-04-06 DIAGNOSIS — R42 Dizziness and giddiness: Secondary | ICD-10-CM | POA: Diagnosis not present

## 2020-04-06 DIAGNOSIS — Z6826 Body mass index (BMI) 26.0-26.9, adult: Secondary | ICD-10-CM | POA: Diagnosis not present

## 2020-04-06 DIAGNOSIS — H9319 Tinnitus, unspecified ear: Secondary | ICD-10-CM | POA: Diagnosis not present

## 2020-04-06 DIAGNOSIS — R358 Other polyuria: Secondary | ICD-10-CM | POA: Diagnosis not present

## 2020-04-06 DIAGNOSIS — E663 Overweight: Secondary | ICD-10-CM | POA: Diagnosis not present

## 2020-04-06 DIAGNOSIS — N950 Postmenopausal bleeding: Secondary | ICD-10-CM | POA: Diagnosis not present

## 2020-04-10 ENCOUNTER — Telehealth (INDEPENDENT_AMBULATORY_CARE_PROVIDER_SITE_OTHER): Payer: Self-pay | Admitting: Gastroenterology

## 2020-04-10 NOTE — Telephone Encounter (Signed)
I called pt w/ results. Having some nausea and abd pain over weekend. Feels blisters in mouth may be from ulcers. Will try protonix twice a day. Has zofran and levsin to use as needed.   She will reschedule colonoscopy when bladder issues resolve. She will call later in week if still having GI Symptoms.

## 2020-04-10 NOTE — Telephone Encounter (Signed)
Patient left voice mail message over the weekend and presented to the office stating she saw you on 5/24 had a colonoscopy on 6/9  - now she has blisters all in her mouth - she thinks it has something to do with her stomach - please advise - [phone]

## 2020-04-12 DIAGNOSIS — N950 Postmenopausal bleeding: Secondary | ICD-10-CM | POA: Diagnosis not present

## 2020-04-12 DIAGNOSIS — Z6826 Body mass index (BMI) 26.0-26.9, adult: Secondary | ICD-10-CM | POA: Diagnosis not present

## 2020-04-17 DIAGNOSIS — I720 Aneurysm of carotid artery: Secondary | ICD-10-CM | POA: Diagnosis not present

## 2020-04-25 DIAGNOSIS — R42 Dizziness and giddiness: Secondary | ICD-10-CM | POA: Diagnosis not present

## 2020-04-25 DIAGNOSIS — H9313 Tinnitus, bilateral: Secondary | ICD-10-CM | POA: Diagnosis not present

## 2020-04-25 DIAGNOSIS — H903 Sensorineural hearing loss, bilateral: Secondary | ICD-10-CM | POA: Diagnosis not present

## 2020-05-02 DIAGNOSIS — N950 Postmenopausal bleeding: Secondary | ICD-10-CM | POA: Diagnosis not present

## 2020-05-19 DIAGNOSIS — N3281 Overactive bladder: Secondary | ICD-10-CM | POA: Diagnosis not present

## 2020-06-07 DIAGNOSIS — R9389 Abnormal findings on diagnostic imaging of other specified body structures: Secondary | ICD-10-CM | POA: Diagnosis not present

## 2020-06-07 DIAGNOSIS — K219 Gastro-esophageal reflux disease without esophagitis: Secondary | ICD-10-CM | POA: Diagnosis not present

## 2020-06-07 DIAGNOSIS — Z01818 Encounter for other preprocedural examination: Secondary | ICD-10-CM | POA: Diagnosis not present

## 2020-06-07 DIAGNOSIS — N950 Postmenopausal bleeding: Secondary | ICD-10-CM | POA: Diagnosis not present

## 2020-06-07 DIAGNOSIS — Z79899 Other long term (current) drug therapy: Secondary | ICD-10-CM | POA: Diagnosis not present

## 2020-06-08 ENCOUNTER — Other Ambulatory Visit: Payer: Self-pay

## 2020-06-08 ENCOUNTER — Ambulatory Visit (INDEPENDENT_AMBULATORY_CARE_PROVIDER_SITE_OTHER): Payer: BLUE CROSS/BLUE SHIELD | Admitting: Neurology

## 2020-06-08 ENCOUNTER — Encounter: Payer: Self-pay | Admitting: Neurology

## 2020-06-08 VITALS — BP 140/90 | HR 64 | Ht 67.0 in | Wt 174.0 lb

## 2020-06-08 DIAGNOSIS — R292 Abnormal reflex: Secondary | ICD-10-CM

## 2020-06-08 DIAGNOSIS — G8929 Other chronic pain: Secondary | ICD-10-CM

## 2020-06-08 DIAGNOSIS — R270 Ataxia, unspecified: Secondary | ICD-10-CM

## 2020-06-08 DIAGNOSIS — R42 Dizziness and giddiness: Secondary | ICD-10-CM

## 2020-06-08 DIAGNOSIS — G1229 Other motor neuron disease: Secondary | ICD-10-CM

## 2020-06-08 DIAGNOSIS — M542 Cervicalgia: Secondary | ICD-10-CM

## 2020-06-08 DIAGNOSIS — I720 Aneurysm of carotid artery: Secondary | ICD-10-CM

## 2020-06-08 DIAGNOSIS — H539 Unspecified visual disturbance: Secondary | ICD-10-CM

## 2020-06-08 DIAGNOSIS — R4020 Unspecified coma: Secondary | ICD-10-CM | POA: Diagnosis not present

## 2020-06-08 DIAGNOSIS — W19XXXA Unspecified fall, initial encounter: Secondary | ICD-10-CM

## 2020-06-08 DIAGNOSIS — H9190 Unspecified hearing loss, unspecified ear: Secondary | ICD-10-CM

## 2020-06-08 NOTE — Progress Notes (Signed)
[facility]    Provider:  Dr Jaynee Eagles Requesting Provider: Redmond School, MD Primary Care Provider:  Redmond School, MD  CC:  Dizziness, vertigo, ears ringling  HPI:  Kara Fowler is a 49 y.o. female here as requested by Redmond School, MD for dizziness and tinnitus see mra.3 months ago. PMHx vertigo,tinnitus, headache,  she was seeing a chiropractor, neck pain, did some adjustments, did not help, she started having dizzy spells and ringing in the ears and headaches. The room may spin, sometimes not, she gets dizzy and she has to grab something to not fall, happens "a lot", happens every day, lasts briefly, she has nausea, she can be laying in bed and feel that way. Headaches come from the back of the neck or the front of the head, pounding, ringing in the ears pulses like a heart beat and sometimes is constant, acute onset, both ears right > left. Headaches are daily and they don;t go away. Vision problems, blurred vision, with the dizziness, she is not driving because of the symptoms. If she stands quickly it can happen. But unknown triggers. She has fallen, she "went out" and woke up on the floor, her heart races, she has not had orthostatics. Sleeping helps. No significant snoring.   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   I reviewed Dr. Nolon Rod goes notes, patient was last seen April 06, 2020, complaining of dizziness, ringing in ears, abdominal pain, bleeding, her dizziness and tinnitus are really bothersome and getting worse, she is fallen multiple times, no injuries incurred, she is also started bleeding after 2 years vaginally, feels poorly in general, she has headaches, paresthesias in her head down the right side of arm into the hand, occasionally has gait instability and is fallen.  She was referred to  urology and ENT and she had already seen GI.  Per notes, sleep likely a major factor, not sleeping well, I do not see an MRI of the brain but only an MRA of the head in the notes.  She says she is up and down all night, insomnia, leg pain, dizzy spells but rarely feels as though she will pass out, occur at random and with different types of activity, neck pain (seeing a chiropractor and was told she is in the early stages of osteoporosis), ears draining, headaches, fatigue, dry mouth.  Blood orders were taken but I do not have those results: CBC with differential, CMP, lipid panel, TSH, ANA, ESR, rheumatoid expanded profile, UA.   Review of Systems: Patient complains of symptoms per HPI as well as the following symptoms:dizziness.  Pertinent negatives and positives per HPI. All others negative.   Social History   Socioeconomic History  . Marital status: Married    Spouse name: Not on file  . Number of children: Not on file  . Years of education: Not on file  . Highest education level: Not on file  Occupational History  . Not on file  Tobacco Use  . Smoking status: Never Smoker  . Smokeless tobacco: Never Used  Vaping Use  . Vaping Use: Never used  Substance and Sexual Activity  . Alcohol use: Yes    Comment: Occ  . Drug use: No  . Sexual activity: Yes  Other Topics Concern  . Not on file  Social History Narrative   Lives at home with husband,  daughter away at college   Social Determinants of Health   Financial Resource Strain:   . Difficulty of Paying Living Expenses: Not on file  Food Insecurity:   . Worried About Charity fundraiser in the Last Year: Not on file  . Ran Out of Food in the Last Year: Not on file  Transportation Needs:   . Lack of Transportation (Medical): Not on file  . Lack of Transportation (Non-Medical): Not on file  Physical Activity:   . Days of Exercise per Week: Not on file  . Minutes of Exercise per Session: Not on file  Stress:   . Feeling of  Stress : Not on file  Social Connections:   . Frequency of Communication with Friends and Family: Not on file  . Frequency of Social Gatherings with Friends and Family: Not on file  . Attends Religious Services: Not on file  . Active Member of Clubs or Organizations: Not on file  . Attends Archivist Meetings: Not on file  . Marital Status: Not on file  Intimate Partner Violence:   . Fear of Current or Ex-Partner: Not on file  . Emotionally Abused: Not on file  . Physically Abused: Not on file  . Sexually Abused: Not on file    Family History  Problem Relation Age of Onset  . Aneurysm Neg Hx   . Headache Neg Hx     Past Medical History:  Diagnosis Date  . GERD (gastroesophageal reflux disease) 04/15/2016  . Headache    sinus  . History of kidney stones   . Ovarian cyst   . Peptic ulcer   . PONV (postoperative nausea and vomiting)   . Sinusitis   . Tinnitus   . Vertigo     Patient Active Problem List   Diagnosis Date Noted  . GERD (gastroesophageal reflux disease) 04/15/2016    Past Surgical History:  Procedure Laterality Date  . APPENDECTOMY    . BIOPSY  05/17/2016   Procedure: BIOPSY;  Surgeon: Rogene Houston, MD;  [facility];  Service: Endoscopy;;  gastric, duodenum  . bladder stretching  2017   dr Amalia Hailey  . CYSTOSCOPY/URETEROSCOPY/HOLMIUM LASER/STENT PLACEMENT Bilateral 11/12/2017   Procedure: CYSTOSCOPY/URETEROSCOPY/HOLMIUM LASER/STENT PLACEMENT;  Surgeon: Nickie Retort, MD;  [facility];  Service: Urology;  Laterality: Bilateral;  . ESOPHAGOGASTRODUODENOSCOPY N/A 05/17/2016   Procedure: ESOPHAGOGASTRODUODENOSCOPY (EGD);  Surgeon: Rogene Houston, MD;  [facility];  Service: Endoscopy;  Laterality: N/A;  2:00 - moved to 10:30 - office to notify  . KIDNEY STONE SURGERY     had stent placed    Current Outpatient Medications  Medication Sig Dispense Refill  . ALPRAZolam (XANAX) 0.25 MG tablet Take  0.5 mg by mouth as needed for anxiety. Usually takes at night.    . celecoxib (CELEBREX) 200 MG capsule Take 200 mg by mouth. Patient is to start today- 03/13/20: She will begin taking one by mouth for 5 days, then off 2 days.    . cetirizine (ZYRTEC) 10 MG tablet Take 10 mg by mouth daily.    Marland Kitchen escitalopram (LEXAPRO) 5 MG tablet Take 5 mg by mouth daily.    . hyoscyamine (LEVSIN SL) 0.125 MG SL tablet Place 1 tablet (0.125 mg total) under the tongue every 6 (six) hours as needed (abd pain). 30 tablet 1  . meclizine (ANTIVERT) 25 MG tablet Take 25 mg by mouth. 1 tablet day or every 6 hours.    Marland Kitchen  Multiple Vitamins-Minerals (CENTRUM ADULTS PO) Take by mouth daily.     . ondansetron (ZOFRAN) 4 MG tablet Take 1 tablet (4 mg total) by mouth every 8 (eight) hours as needed for nausea or vomiting. 30 tablet 3  . pantoprazole (PROTONIX) 40 MG tablet Take 1 tablet (40 mg total) by mouth daily. (Patient taking differently: Take 40-80 mg by mouth daily. ) 90 tablet 3  . Vibegron (GEMTESA) 75 MG TABS Take 1 tablet by mouth daily.    Marland Kitchen zolpidem (AMBIEN) 10 MG tablet Take 10 mg by mouth at bedtime as needed for sleep.     No current facility-administered medications for this visit.    Allergies as of 06/08/2020  . (No Known Allergies)    Vitals: BP 140/90 (BP Location: Left Arm, Patient Position: Sitting)   Pulse 64   Ht '5\' 7"'  (1.702 m)   Wt 174 lb (78.9 kg)   BMI 27.25 kg/m  Last Weight:  Wt Readings from Last 1 Encounters:  06/08/20 174 lb (78.9 kg)   Last Height:   Ht Readings from Last 1 Encounters:  06/08/20 '5\' 7"'  (1.702 m)     Physical exam: Exam: Gen: NAD, conversant, well nourised, well groomed                     CV: RRR, no MRG. No Carotid Bruits. No peripheral edema, warm, nontender Eyes: Conjunctivae clear without exudates or hemorrhage  Neuro: Detailed Neurologic Exam  Speech:    Speech is normal; fluent and spontaneous with normal comprehension.  Cognition:    The  patient is oriented to person, place, and time;     recent and remote memory intact;     language fluent;     normal attention, concentration,     fund of knowledge Cranial Nerves:    The pupils are equal, round, and reactive to light. The fundi are normal and spontaneous venous pulsations are present. Visual fields are full to finger confrontation. Extraocular movements are intact. Trigeminal sensation is intact and the muscles of mastication are normal. The face is symmetric. The palate elevates in the midline. Hearing intact. Voice is normal. Shoulder shrug is normal. The tongue has normal motion without fasciculations.   Coordination:    Normal finger to nose and heel to shin.   Gait:    Heel-toe and tandem gait are normal.   Motor Observation:    No asymmetry, no atrophy, and no involuntary movements noted. Tone:    Normal muscle tone.    Posture:    Posture is normal. normal erect    Strength:    Strength is V/V in the upper and lower limbs.      Sensation: intact to LT     Reflex Exam:  DTR's:    Deep tendon reflexes in the upper and lower extremities are brisk bilaterally.   Toes:    The toes are equiv bilaterally.   Clonus:    Clonus is absent. 2 beats AJs   +Hoffman's  Assessment/Plan: This is a patient is experiencing multiple neurologic problems including vertigo, dizziness, hearing changes and tinnitus, neck pain, positional headaches, vision changes after being adjusted to chiropractic.  She has very brisk reflexes indicating an upper motor neuron lesion.  An MRA of the head showed an aneurysm but I think this aneurysm is incidental and not causing her symptoms.  She continues to worsen and I think she needs a thorough evaluation.   MRI of the brain and  MRI of the cervical spine: Given patient's symptoms above we really need to look for space occupying mass, CSF leak, schwannomas, low pressure high pressure, stroke, cervical myelopathy or other cervical spine  problem, demyelinating lesion.  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  Follow up with ENT and hearing test  Vestibular therapy   Orders Placed This Encounter  Procedures  . MR BRAIN W WO CONTRAST  . MR CERVICAL SPINE WO CONTRAST  . CT ANGIO NECK W OR WO CONTRAST  . CT ANGIO HEAD W OR WO CONTRAST  . Ambulatory referral to Physical Therapy    Cc: Redmond School, MD,  Redmond School, MD  Sarina Ill, MD  [facility] [address] [place], [place] [address]  Phone [phone] Fax [phone]

## 2020-06-08 NOTE — Patient Instructions (Addendum)
MRI of the brain and cervical spine CT angiogram of the blood vessels of the head and neck See ENT    Dizziness Dizziness is a common problem. It makes you feel unsteady or light-headed. You may feel like you are about to pass out (faint). Dizziness can lead to getting hurt if you stumble or fall. Dizziness can be caused by many things, including:  Medicines.  Not having enough water in your body (dehydration).  Illness. Follow these instructions at home: Eating and drinking   Drink enough fluid to keep your pee (urine) clear or pale yellow. This helps to keep you from getting dehydrated. Try to drink more clear fluids, such as water.  Do not drink alcohol.  Limit how much caffeine you drink or eat, if your doctor tells you to do that.  Limit how much salt (sodium) you drink or eat, if your doctor tells you to do that. Activity   Avoid making quick movements. ? When you stand up from sitting in a chair, steady yourself until you feel okay. ? In the morning, first sit up on the side of the bed. When you feel okay, stand slowly while you hold onto something. Do this until you know that your balance is fine.  If you need to stand in one place for a long time, move your legs often. Tighten and relax the muscles in your legs while you are standing.  Do not drive or use heavy machinery if you feel dizzy.  Avoid bending down if you feel dizzy. Place items in your home so you can reach them easily without leaning over. Lifestyle  Do not use any products that contain nicotine or tobacco, such as cigarettes and e-cigarettes. If you need help quitting, ask your doctor.  Try to lower your stress level. You can do this by using methods such as yoga or meditation. Talk with your doctor if you need help. General instructions  Watch your dizziness for any changes.  Take over-the-counter and prescription medicines only as told by your doctor. Talk with your doctor if you think that you  are dizzy because of a medicine that you are taking.  Tell a friend or a family member that you are feeling dizzy. If he or she notices any changes in your behavior, have this person call your doctor.  Keep all follow-up visits as told by your doctor. This is important. Contact a doctor if:  Your dizziness does not go away.  Your dizziness or light-headedness gets worse.  You feel sick to your stomach (nauseous).  You have trouble hearing.  You have new symptoms.  You are unsteady on your feet.  You feel like the room is spinning. Get help right away if:  You throw up (vomit) or have watery poop (diarrhea), and you cannot eat or drink anything.  You have trouble: ? Talking. ? Walking. ? Swallowing. ? Using your arms, hands, or legs.  You feel generally weak.  You are not thinking clearly, or you have trouble forming sentences. A friend or family member may notice this.  You have: ? Chest pain. ? Pain in your belly (abdomen). ? Shortness of breath. ? Sweating.  Your vision changes.  You are bleeding.  You have a very bad headache.  You have neck pain or a stiff neck.  You have a fever. These symptoms may be an emergency. Do not wait to see if the symptoms will go away. Get medical help right away. Call your local  emergency services (911 in the [place] Do not drive yourself to the hospital. Summary  Dizziness makes you feel unsteady or light-headed. You may feel like you are about to pass out (faint).  Drink enough fluid to keep your pee (urine) clear or pale yellow. Do not drink alcohol.  Avoid making quick movements if you feel dizzy.  Watch your dizziness for any changes. This information is not intended to replace advice given to you by your health care provider. Make sure you discuss any questions you have with your health care provider. Document Revised: 10/10/2017 Document Reviewed: 10/24/2016 Elsevier Patient Education  2020 [employer].

## 2020-06-09 DIAGNOSIS — Z79899 Other long term (current) drug therapy: Secondary | ICD-10-CM | POA: Diagnosis not present

## 2020-06-09 DIAGNOSIS — Z01818 Encounter for other preprocedural examination: Secondary | ICD-10-CM | POA: Diagnosis not present

## 2020-06-09 DIAGNOSIS — K219 Gastro-esophageal reflux disease without esophagitis: Secondary | ICD-10-CM | POA: Diagnosis not present

## 2020-06-09 DIAGNOSIS — N950 Postmenopausal bleeding: Secondary | ICD-10-CM | POA: Diagnosis not present

## 2020-06-09 DIAGNOSIS — R9389 Abnormal findings on diagnostic imaging of other specified body structures: Secondary | ICD-10-CM | POA: Diagnosis not present

## 2020-06-12 ENCOUNTER — Telehealth: Payer: Self-pay | Admitting: Neurology

## 2020-06-12 NOTE — Telephone Encounter (Signed)
BCBS Auth: [id number] (exp. 06/12/20 to 12/08/20) order sent to GI. They will reach out to the patient to schedule.

## 2020-06-12 NOTE — Telephone Encounter (Signed)
unable to leave vmail the mail box is full  BCBS Auth: [id number] (exp. 06/12/20 to 12/08/20)

## 2020-06-19 DIAGNOSIS — R42 Dizziness and giddiness: Secondary | ICD-10-CM | POA: Diagnosis not present

## 2020-06-21 ENCOUNTER — Ambulatory Visit
Admission: RE | Admit: 2020-06-21 | Discharge: 2020-06-21 | Disposition: A | Discharge status: Home / Self Care | Payer: BLUE CROSS/BLUE SHIELD | Source: Ambulatory Visit | Attending: Neurology | Admitting: Neurology

## 2020-06-21 ENCOUNTER — Ambulatory Visit
Admission: RE | Admit: 2020-06-21 | Discharge: 2020-06-21 | Disposition: A | Discharge status: Home / Self Care | Payer: BC Managed Care – PPO | Source: Ambulatory Visit | Attending: Neurology | Admitting: Neurology

## 2020-06-21 DIAGNOSIS — R42 Dizziness and giddiness: Secondary | ICD-10-CM | POA: Diagnosis not present

## 2020-06-21 DIAGNOSIS — I720 Aneurysm of carotid artery: Secondary | ICD-10-CM

## 2020-06-21 DIAGNOSIS — W19XXXA Unspecified fall, initial encounter: Secondary | ICD-10-CM

## 2020-06-21 MED ORDER — IOPAMIDOL (ISOVUE-370) INJECTION 76%
75.0000 mL | Freq: Once | INTRAVENOUS | Status: AC | PRN
  Administered 2020-06-21: 75 mL via INTRAVENOUS

## 2020-06-22 ENCOUNTER — Telehealth: Payer: Self-pay | Admitting: Neurology

## 2020-06-22 NOTE — Telephone Encounter (Signed)
-----   Message from Bertram Savin, RN sent at 06/22/2020 11:16 AM EDT ----- Dr Lucia Gaskins requested for you to result this while she is out of the office. Thank you!  ----- Message ----- From: Interface, Rad Results In Sent: 06/21/2020   2:26 PM EDT To: Anson Fret, MD

## 2020-06-22 NOTE — Telephone Encounter (Signed)
  I called the patient.  CT angiogram of the head and neck were unremarkable, aneurysm was not identified.  I discussed this with the patient.   CTA head and neck 06/22/20:  IMPRESSION: 1. Normal appearance of the brain itself. 2. Normal CT angiography of the neck and intracranial vessels. The study does not show a carotid siphon aneurysm. However, based on the small size of the abnormality described at MRI, MRA can be more sensitive in this location because of the contrast enhancement of the cavernous sinus at CT angiography.

## 2020-06-23 ENCOUNTER — Other Ambulatory Visit: Payer: Self-pay

## 2020-06-23 ENCOUNTER — Ambulatory Visit
Admission: RE | Admit: 2020-06-23 | Discharge: 2020-06-23 | Disposition: A | Discharge status: Home / Self Care | Payer: BC Managed Care – PPO | Source: Ambulatory Visit | Attending: Neurology | Admitting: Neurology

## 2020-06-23 ENCOUNTER — Ambulatory Visit
Admission: RE | Admit: 2020-06-23 | Discharge: 2020-06-23 | Disposition: A | Discharge status: Home / Self Care | Payer: BLUE CROSS/BLUE SHIELD | Source: Ambulatory Visit | Attending: Neurology | Admitting: Neurology

## 2020-06-23 DIAGNOSIS — R292 Abnormal reflex: Secondary | ICD-10-CM

## 2020-06-23 DIAGNOSIS — R42 Dizziness and giddiness: Secondary | ICD-10-CM

## 2020-06-23 DIAGNOSIS — H9190 Unspecified hearing loss, unspecified ear: Secondary | ICD-10-CM

## 2020-06-23 DIAGNOSIS — R270 Ataxia, unspecified: Secondary | ICD-10-CM

## 2020-06-23 DIAGNOSIS — G8929 Other chronic pain: Secondary | ICD-10-CM

## 2020-06-23 DIAGNOSIS — G1229 Other motor neuron disease: Secondary | ICD-10-CM

## 2020-06-23 DIAGNOSIS — W19XXXA Unspecified fall, initial encounter: Secondary | ICD-10-CM

## 2020-06-23 DIAGNOSIS — R4020 Unspecified coma: Secondary | ICD-10-CM

## 2020-06-23 DIAGNOSIS — M542 Cervicalgia: Secondary | ICD-10-CM | POA: Diagnosis not present

## 2020-06-23 DIAGNOSIS — H539 Unspecified visual disturbance: Secondary | ICD-10-CM

## 2020-06-23 MED ORDER — GADOBENATE DIMEGLUMINE 529 MG/ML IV SOLN
15.0000 mL | Freq: Once | INTRAVENOUS | Status: AC | PRN
  Administered 2020-06-23: 15 mL via INTRAVENOUS

## 2020-06-27 DIAGNOSIS — H903 Sensorineural hearing loss, bilateral: Secondary | ICD-10-CM | POA: Diagnosis not present

## 2020-06-27 DIAGNOSIS — R42 Dizziness and giddiness: Secondary | ICD-10-CM | POA: Diagnosis not present

## 2020-06-27 DIAGNOSIS — H9313 Tinnitus, bilateral: Secondary | ICD-10-CM | POA: Diagnosis not present

## 2020-06-28 ENCOUNTER — Ambulatory Visit (HOSPITAL_COMMUNITY): Payer: BLUE CROSS/BLUE SHIELD | Attending: Neurology | Admitting: Physical Therapy

## 2020-06-28 ENCOUNTER — Other Ambulatory Visit: Payer: Self-pay

## 2020-06-28 DIAGNOSIS — M542 Cervicalgia: Secondary | ICD-10-CM | POA: Insufficient documentation

## 2020-06-28 DIAGNOSIS — H8111 Benign paroxysmal vertigo, right ear: Secondary | ICD-10-CM | POA: Diagnosis not present

## 2020-06-28 DIAGNOSIS — M436 Torticollis: Secondary | ICD-10-CM | POA: Diagnosis not present

## 2020-06-28 NOTE — Therapy (Signed)
[facility] [address] [place], [place], [address] Phone: [phone]   Fax:  [phone]  Physical Therapy Evaluation  Patient Details  Name: Kara Fowler MRN: [id number] Date of Birth: Aug 10, 1971 Referring Provider (PT): Naomie Dean   Encounter Date: 06/28/2020   PT End of Session - 06/28/20 1015    Visit Number 1    Number of Visits 8    Date for PT Re-Evaluation 07/28/20    Authorization Type BCBS    Progress Note Due on Visit 8    PT Start Time 0825    PT Stop Time 0915    PT Time Calculation (min) 50 min    Activity Tolerance Patient tolerated treatment well    Behavior During Therapy [facility] for tasks assessed/performed           Past Medical History:  Diagnosis Date  . GERD (gastroesophageal reflux disease) 04/15/2016  . Headache    sinus  . History of kidney stones   . Ovarian cyst   . Peptic ulcer   . PONV (postoperative nausea and vomiting)   . Sinusitis   . Tinnitus   . Vertigo     Past Surgical History:  Procedure Laterality Date  . APPENDECTOMY    . BIOPSY  05/17/2016   Procedure: BIOPSY;  Surgeon: Malissa Hippo, MD;  [facility];  Service: Endoscopy;;  gastric, duodenum  . bladder stretching  2017   dr Logan Bores  . CYSTOSCOPY/URETEROSCOPY/HOLMIUM LASER/STENT PLACEMENT Bilateral 11/12/2017   Procedure: CYSTOSCOPY/URETEROSCOPY/HOLMIUM LASER/STENT PLACEMENT;  Surgeon: Hildred Laser, MD;  Location: [facility];  Service: Urology;  Laterality: Bilateral;  . ESOPHAGOGASTRODUODENOSCOPY N/A 05/17/2016   Procedure: ESOPHAGOGASTRODUODENOSCOPY (EGD);  Surgeon: Malissa Hippo, MD;  [facility];  Service: Endoscopy;  Laterality: N/A;  2:00 - moved to 10:30 - office to notify  . KIDNEY STONE SURGERY     had stent placed    There were no vitals filed for this visit.    Subjective Assessment - 06/28/20 1034    Currently in Pain? Yes    Pain Score 5     Pain Location  Neck    Pain Descriptors / Indicators Aching    Pain Type Chronic pain    Pain Onset More than a month ago    Pain Frequency Intermittent    Aggravating Factors  not sure    Pain Relieving Factors not sure    Effect of Pain on Daily Activities keeps going              [facility] PT Assessment - 06/28/20 0001      Assessment   Medical Diagnosis dizziness    Referring Provider (PT) Naomie Dean    Onset Date/Surgical Date 03/04/20    Prior Therapy none      Precautions   Precautions None      Restrictions   Weight Bearing Restrictions No      Balance Screen   Has the patient fallen in the past 6 months No    Has the patient had a decrease in activity level because of a fear of falling?  No    Is the patient reluctant to leave their home because of a fear of falling?  No      Prior Function   Level of Independence Independent      Cognition   Overall Cognitive Status Within Functional Limits for tasks assessed      ROM /  Strength   AROM / PROM / Strength AROM      AROM   AROM Assessment Site Cervical    Cervical - Right Side Bend 29    Cervical - Left Side Bend 35    Cervical - Right Rotation 45    Cervical - Left Rotation 60                  Vestibular Assessment - 06/28/20 0001      Symptom Behavior   Subjective history of current problem Kara Fowler states that several months ago she was haviing problems with her neck and went to see a chiropractor, right after she had dizziness and tinnitus.      Type of Dizziness  Lightheadedness;Vertigo    Frequency of Dizziness random at least 5 x a day     Duration of Dizziness from 10' to longer     Symptom Nature Spontaneous    Aggravating Factors No known aggravating factors    Relieving Factors No known relieving factors    Progression of Symptoms No change since onset    History of similar episodes --   none     Oculomotor Exam   Oculomotor Alignment Normal    Spontaneous Absent    Head shaking Horizontal  R beating nystagmus    Smooth Pursuits Saccades   Rt eye    Saccades Overshoots;Poor trajectory      Vestibulo-Ocular Reflex   VOR 1 Head Only (x 1 viewing) slight vertigo     VOR 2 Head and Object (x 2 viewing) no change       Positional Testing   Dix-Hallpike Dix-Hallpike Right;Dix-Hallpike Left      Dix-Hallpike Right   Dix-Hallpike Right Symptoms Upbeat, right rotatory nystagmus      Dix-Hallpike Left   Dix-Hallpike Left Symptoms Upbeat, right rotatory nystagmus      Positional Sensitivities   Right Hallpike Moderate dizziness    Up from Right Hallpike Moderate dizziness    Up from Left Hallpike Moderate dizziness    Head Turning x 5 Lightheadedness    Head Nodding x 5 No dizziness              Objective measurements completed on examination: See above findings.       [facility] Adult PT Treatment/Exercise - 06/28/20 0001      Exercises   Exercises Neck      Neck Exercises: Seated   Cervical Rotation Both;10 reps    Lateral Flexion Both;5 reps    Other Seated Exercise eye motion x 5    Other Seated Exercise eye tracking x 5            Vestibular Treatment/Exercise - 06/28/20 0001      Vestibular Treatment/Exercise   Vestibular Treatment Provided Canalith Repositioning    Canalith Repositioning Epley Manuever Right       EPLEY MANUEVER RIGHT   Number of Reps  2    Overall Response Improved Symptoms                 PT Education - 06/28/20 1013    Education Details HEP    Person(s) Educated Patient    Methods Explanation;Verbal cues;Handout    Comprehension Verbalized understanding;Returned demonstration            PT Short Term Goals - 06/28/20 1027      PT SHORT TERM GOAL #1   Title PT to be I in HEP to allow pt to state  that she is only having sx of dizziness 3 or less times a day.    Baseline at least 5    Time 2    Period Weeks    Status New    Target Date 07/12/20             PT Long Term Goals - 06/28/20 1031      PT  LONG TERM GOAL #1   Title PT to be I in advance HEP to allow sx of dizziness to decrease to no more than 1 x per day.    Time 4    Period Weeks    Status New    Target Date 07/26/20      PT LONG TERM GOAL #2   Title Pt to be able to turn her head in both directions at least 70 degrees to allow safer driving    Time 4    Period Weeks    Status New      PT LONG TERM GOAL #3   Title cervical pain to be no greater than a 2/10    Time 4    Period Weeks    Status New                  Plan - 06/28/20 1016    Clinical Impression Statement Kara Fowler is a 49 yo female who began to have vertigo and tinnitus after she went to see a chiropractor for cervical pain.  She has since stopped seeing the chiropractor but still has complaints of dizziness at least 5 x a day and constant tinnitus.  She has been referred to skilled PT for vestibular therapy.  Evaluation shows positive nystagmus of the Rt eye with [facility] as well as decreased cervical ROM and increased mm spasm in B upper trap.  Kara Fowler will benefit from skilled PT to address these issues and decrease her complaints of dizziness.    Examination-Activity Limitations Other    Examination-Participation Restrictions Driving;Shop;Yard Work;Cleaning;Community Activity;Occupation    Stability/Clinical Decision Making Evolving/Moderate complexity    Clinical Decision Making Moderate    Rehab Potential Good    PT Frequency 2x / week    PT Duration 4 weeks    PT Treatment/Interventions Patient/family education;Manual techniques;Therapeutic activities;Therapeutic exercise;Other (comment)   cannalith repositioning   PT Next Visit Plan assess response to eplys; complete balance assessment,(Single leg stance will be sufficient), complete Dix halpike manuever to see if Eplys needs to becompleted,  Begin sitting posture exercise, scapular retraction, cervical retraction. cervical stretching and manual    PT Home Exercise Plan eye  tracking, cervical side bend and rotation           Patient will benefit from skilled therapeutic intervention in order to improve the following deficits and impairments:  Decreased balance, Dizziness, Decreased range of motion, Postural dysfunction, Increased muscle spasms  Visit Diagnosis: BPPV (benign paroxysmal positional vertigo), right  Stiffness of cervical spine  Cervicalgia     Problem List Patient Active Problem List   Diagnosis Date Noted  . GERD (gastroesophageal reflux disease) 04/15/2016    Virgina Organ, PT CLT [phone] 06/28/2020, 10:35 AM  [facility] [address] [place], [place], [address] Phone: [phone]   Fax:  [phone]  Name: Kara Fowler MRN: [id number] Date of Birth: 1970/11/29

## 2020-06-28 NOTE — Patient Instructions (Signed)
Gaze Stabilization: Sitting    Keeping eyes on target on wall \\_10N  feet away, tilt head down 15-30 and move head side to side for __10__ seconds. Repeat while moving head up and down for _10___ seconds. Do _5___ sessions per day. Repeat using target on pattern background.  Copyright  VHI. All rights reserved.  Movements: Eyes Only (Pictorial Reference)    Therapist: Use this card with Eye Exercises 14 through 17.   Copyright  VHI. All rights reserved.

## 2020-06-30 ENCOUNTER — Ambulatory Visit (HOSPITAL_COMMUNITY): Payer: BLUE CROSS/BLUE SHIELD

## 2020-06-30 ENCOUNTER — Encounter (HOSPITAL_COMMUNITY): Payer: Self-pay

## 2020-06-30 ENCOUNTER — Other Ambulatory Visit: Payer: Self-pay

## 2020-06-30 DIAGNOSIS — H8111 Benign paroxysmal vertigo, right ear: Secondary | ICD-10-CM

## 2020-06-30 DIAGNOSIS — M542 Cervicalgia: Secondary | ICD-10-CM

## 2020-06-30 DIAGNOSIS — M436 Torticollis: Secondary | ICD-10-CM | POA: Diagnosis not present

## 2020-06-30 NOTE — Patient Instructions (Signed)
Scapular Retraction (Standing)    With arms at sides, pinch shoulder blades together. Repeat 10 times per set. Do 2 sets per day.  http://orth.exer.us/945   Copyright  VHI. All rights reserved.

## 2020-06-30 NOTE — Therapy (Signed)
[facility] [address] [place], [place], [address] Phone: [phone]   Fax:  [phone]  Physical Therapy Treatment  Patient Details  Name: Kara Fowler MRN: [id number] Date of Birth: Dec 16, 1970 Referring Provider (PT): Naomie Dean   Encounter Date: 06/30/2020   PT End of Session - 06/30/20 0920    Visit Number 2    Number of Visits 8    Date for PT Re-Evaluation 07/28/20    Authorization Type BCBS    Progress Note Due on Visit 8    PT Start Time 0832    PT Stop Time 0914    PT Time Calculation (min) 42 min    Activity Tolerance Patient tolerated treatment well    Behavior During Therapy [facility] for tasks assessed/performed           Past Medical History:  Diagnosis Date  . GERD (gastroesophageal reflux disease) 04/15/2016  . Headache    sinus  . History of kidney stones   . Ovarian cyst   . Peptic ulcer   . PONV (postoperative nausea and vomiting)   . Sinusitis   . Tinnitus   . Vertigo     Past Surgical History:  Procedure Laterality Date  . APPENDECTOMY    . BIOPSY  05/17/2016   Procedure: BIOPSY;  Surgeon: Malissa Hippo, MD;  [facility];  Service: Endoscopy;;  gastric, duodenum  . bladder stretching  2017   dr Logan Bores  . CYSTOSCOPY/URETEROSCOPY/HOLMIUM LASER/STENT PLACEMENT Bilateral 11/12/2017   Procedure: CYSTOSCOPY/URETEROSCOPY/HOLMIUM LASER/STENT PLACEMENT;  Surgeon: Hildred Laser, MD;  Location: [facility];  Service: Urology;  Laterality: Bilateral;  . ESOPHAGOGASTRODUODENOSCOPY N/A 05/17/2016   Procedure: ESOPHAGOGASTRODUODENOSCOPY (EGD);  Surgeon: Malissa Hippo, MD;  [facility];  Service: Endoscopy;  Laterality: N/A;  2:00 - moved to 10:30 - office to notify  . KIDNEY STONE SURGERY     had stent placed    There were no vitals filed for this visit.   Subjective Assessment - 06/30/20 0833    Subjective Pt reports no real changes with dizziness, unable  to target any motion that increases her dizziness.  Reports some difficulty wiht Rt compared Lt with HEP activities. Reports some neck pain posterior, 6/10 sharp constant pain.    Currently in Pain? Yes    Pain Score 6     Pain Location Neck    Pain Orientation Posterior    Pain Descriptors / Indicators Sharp    Pain Type Chronic pain    Pain Onset More than a month ago    Pain Frequency Constant    Aggravating Factors  not sure    Pain Relieving Factors tried MHP, pain patches, roll-on, gel doesn't help    Effect of Pain on Daily Activities keeps going                             [facility] Adult PT Treatment/Exercise - 06/30/20 0001      Exercises   Exercises Neck      Neck Exercises: Seated   Cervical Rotation Both;10 reps    Cervical Rotation Limitations 3D cervical excursion    Lateral Flexion Both;10 reps    Other Seated Exercise eye motion x 5; eye tracking x 5     Other Seated Exercise scapular retraction 10x      Manual Therapy   Manual Therapy Soft tissue mobilization    Manual  therapy comments Manual complete separate than rest of tx    Soft tissue mobilization supine position with LE elevated:  STM to cervical mm UT, scalenes, levator, paraspinals, suboccipital release and manual traction           Vestibular Treatment/Exercise - 06/30/20 0001      Vestibular Treatment/Exercise   Gaze Exercises X1 Viewing Horizontal;Eye/Head Exercise Horizontal;X1 Viewing Vertical       EPLEY MANUEVER RIGHT   Number of Reps  5      X1 Viewing Horizontal   Foot Position sitting    Reps 10    Comments increased dizziness to level 6 following Rt to Lt rotation      X1 Viewing Vertical   Foot Position sitting    Reps 10      Eye/Head Exercise Horizontal   Foot Position sitting    Reps 10    Comments increased dizziness to level 6 following Rt to Lt rotation                 PT Education - 06/30/20 0920    Education Details Reviewed  goals,  educated importance of HEP compliance; pt able to verbalize current HEP she has began at home.    Person(s) Educated Patient    Methods Explanation;Demonstration;Verbal cues    Comprehension Verbalized understanding;Returned demonstration            PT Short Term Goals - 06/28/20 1027      PT SHORT TERM GOAL #1   Title PT to be I in HEP to allow pt to state that she is only having sx of dizziness 3 or less times a day.    Baseline at least 5    Time 2    Period Weeks    Status New    Target Date 07/12/20             PT Long Term Goals - 06/28/20 1031      PT LONG TERM GOAL #1   Title PT to be I in advance HEP to allow sx of dizziness to decrease to no more than 1 x per day.    Time 4    Period Weeks    Status New    Target Date 07/26/20      PT LONG TERM GOAL #2   Title Pt to be able to turn her head in both directions at least 70 degrees to allow safer driving    Time 4    Period Weeks    Status New      PT LONG TERM GOAL #3   Title cervical pain to be no greater than a 2/10    Time 4    Period Weeks    Status New                 Plan - 06/30/20 1258    Clinical Impression Statement Reviewed goals, educated importance of compliance with HEP for maximal benefits, pt able to verbalize exercise and reports increased difficulty when looking Rt or transitioning to Lt after looking Rt.  Gaze exercises complete with some difficulty with eyes then head, increased verbal cueing required for proper mechanics.  Assess SLS with 5" max BLE indicating risk of fall.  Cervical mobility exercises complete and EOS with manual STM to address significant tightness in upper trap, cervical paraspinal and tension along suboccipical lobes.  EOS reports of increased pain and dizziness at level 6/10.    Examination-Activity Limitations Other  Examination-Participation Restrictions Driving;Shop;Yard Work;Cleaning;Community Activity;Occupation    Stability/Clinical Decision Making  Evolving/Moderate complexity    Clinical Decision Making Moderate    Rehab Potential Good    PT Frequency 2x / week    PT Duration 4 weeks    PT Treatment/Interventions Patient/family education;Manual techniques;Therapeutic activities;Therapeutic exercise;Other (comment)    PT Next Visit Plan assess response to eplys; complete Dix halpike manuever to see if Eplys needs to becompleted,  Continue sitting posture exercise, scapular retraction, cervical retraction. cervical stretching and manual    PT Home Exercise Plan eye tracking, cervical side bend and rotation           Patient will benefit from skilled therapeutic intervention in order to improve the following deficits and impairments:  Decreased balance, Dizziness, Decreased range of motion, Postural dysfunction, Increased muscle spasms  Visit Diagnosis: BPPV (benign paroxysmal positional vertigo), right  Stiffness of cervical spine  Cervicalgia     Problem List Patient Active Problem List   Diagnosis Date Noted  . GERD (gastroesophageal reflux disease) 04/15/2016   Becky Sax, LPTA/CLT; CBIS [phone]  Juel Burrow 06/30/2020, 1:03 PM  [place] [facility] [address] [place], [place], [address] Phone: [phone]   Fax:  [phone]  Name: Kara Fowler MRN: [id number] Date of Birth: 1970/12/12

## 2020-07-11 ENCOUNTER — Other Ambulatory Visit: Payer: Self-pay

## 2020-07-11 ENCOUNTER — Ambulatory Visit (HOSPITAL_COMMUNITY): Payer: BLUE CROSS/BLUE SHIELD | Admitting: Physical Therapy

## 2020-07-11 DIAGNOSIS — H8111 Benign paroxysmal vertigo, right ear: Secondary | ICD-10-CM | POA: Diagnosis not present

## 2020-07-11 DIAGNOSIS — M436 Torticollis: Secondary | ICD-10-CM | POA: Diagnosis not present

## 2020-07-11 DIAGNOSIS — M542 Cervicalgia: Secondary | ICD-10-CM

## 2020-07-11 NOTE — Therapy (Signed)
[facility] [address] [place], [place], [address] Phone: [phone]   Fax:  [phone]  Physical Therapy Treatment  Patient Details  Name: Kara Fowler MRN: [id number] Date of Birth: [birth year] Referring Provider (PT): Naomie Dean   Encounter Date: 07/11/2020   PT End of Session - 07/11/20 1605    Visit Number 3    Number of Visits 8    Date for PT Re-Evaluation 07/28/20    Authorization Type BCBS    Progress Note Due on Visit 8    PT Start Time 1530    PT Stop Time 1610    PT Time Calculation (min) 40 min    Activity Tolerance Patient tolerated treatment well    Behavior During Therapy  [facility] for tasks assessed/performed           Past Medical History:  Diagnosis Date  . GERD (gastroesophageal reflux disease) 04/15/2016  . Headache    sinus  . History of kidney stones   . Ovarian cyst   . Peptic ulcer   . PONV (postoperative nausea and vomiting)   . Sinusitis   . Tinnitus   . Vertigo     Past Surgical History:  Procedure Laterality Date  . APPENDECTOMY    . BIOPSY  05/17/2016   Procedure: BIOPSY;  Surgeon: Malissa Hippo, MD;  [facility];  Service: Endoscopy;;  gastric, duodenum  . bladder stretching  2017   dr Logan Bores  . CYSTOSCOPY/URETEROSCOPY/HOLMIUM LASER/STENT PLACEMENT Bilateral 11/12/2017   Procedure: CYSTOSCOPY/URETEROSCOPY/HOLMIUM LASER/STENT PLACEMENT;  Surgeon: Hildred Laser, MD;  Location: [facility];  Service: Urology;  Laterality: Bilateral;  . ESOPHAGOGASTRODUODENOSCOPY N/A 05/17/2016   Procedure: ESOPHAGOGASTRODUODENOSCOPY (EGD);  Surgeon: Malissa Hippo, MD;  [facility];  Service: Endoscopy;  Laterality: N/A;  2:00 - moved to 10:30 - office to notify  . KIDNEY STONE SURGERY     had stent placed    There were no vitals filed for this visit.   Subjective Assessment - 07/11/20 1527    Subjective Pt states that she is having good and bad days but  more bad than good.  She is doing her exercises.    Pain Onset More than a month ago                   Vestibular Assessment - 07/11/20 0001      Dix-Hallpike Right   Dix-Hallpike Right Symptoms No nystagmus      Dix-Hallpike Left   Dix-Hallpike Left Symptoms No nystagmus               [facility] Adult PT Treatment/Exercise - 07/11/20 0001      Exercises   Exercises Neck      Neck Exercises: Seated   Other Seated Exercise eye motion x 5; eye tracking x 5       Neck Exercises: Supine   Neck Retraction 10 reps    Neck Retraction Limitations scapular retraction  x 10       Manual Therapy   Manual Therapy Soft tissue mobilization    Manual therapy comments Manual complete separate than rest of tx    Soft tissue mobilization supine position with LE elevated:  STM to cervical mm UT, scalenes, levator, paraspinals, suboccipital release and manual traction           Vestibular Treatment/Exercise - 07/11/20 0001      Vestibular Treatment/Exercise   Habituation Exercises Comment   nose  to knee RT/ LT x 5 each              Balance Exercises - 07/11/20 0001      Balance Exercises: Standing   Tandem Stance 5 reps   x 2 reps with each leg in front with head turns    SLS Eyes open;5 reps   with head nods    Tandem Gait Forward;2 reps    Marching Solid surface;10 reps               PT Short Term Goals - 06/28/20 1027      PT SHORT TERM GOAL #1   Title PT to be I in HEP to allow pt to state that she is only having sx of dizziness 3 or less times a day.    Baseline at least 5    Time 2    Period Weeks    Status New    Target Date 07/12/20             PT Long Term Goals - 06/28/20 1031      PT LONG TERM GOAL #1   Title PT to be I in advance HEP to allow sx of dizziness to decrease to no more than 1 x per day.    Time 4    Period Weeks    Status New    Target Date 07/26/20      PT LONG TERM GOAL #2   Title Pt to be able to turn her head in  both directions at least 70 degrees to allow safer driving    Time 4    Period Weeks    Status New      PT LONG TERM GOAL #3   Title cervical pain to be no greater than a 2/10    Time 4    Period Weeks    Status New                 Plan - 07/11/20 1609    Clinical Impression Statement Began session with Dix-halpike which was (-).   Progressed to sitting habitiuation and balance exercises as well as manual to increase cervical ROM.    Examination-Activity Limitations Other    Examination-Participation Restrictions Driving;Shop;Yard Work;Cleaning;Community Activity;Occupation    Stability/Clinical Decision Making Evolving/Moderate complexity    Rehab Potential Good    PT Frequency 2x / week    PT Duration 4 weeks    PT Treatment/Interventions Patient/family education;Manual techniques;Therapeutic activities;Therapeutic exercise;Other (comment)    PT Next Visit Plan Continue sitting habituation , balance  posture exercise, scapular retraction, cervical retraction. cervical stretching and manual    PT Home Exercise Plan eye tracking, cervical side bend and rotation           Patient will benefit from skilled therapeutic intervention in order to improve the following deficits and impairments:  Decreased balance, Dizziness, Decreased range of motion, Postural dysfunction, Increased muscle spasms  Visit Diagnosis: Stiffness of cervical spine  BPPV (benign paroxysmal positional vertigo), right  Cervicalgia     Problem List Patient Active Problem List   Diagnosis Date Noted  . GERD (gastroesophageal reflux disease) 04/15/2016    Virgina Organ, PT CLT [phone] 07/11/2020, 4:16 PM  [facility] [address] [place], [place], [address] Phone: [phone]   Fax:  [phone]  Name: Kara Fowler MRN: [id number] Date of Birth: Oct 13, 1971

## 2020-07-11 NOTE — Patient Instructions (Signed)
Bending / Picking Up Objects    Sitting, slowly bend head down and pick up object on the floor. Return to upright position. Hold position until symptoms subside. Repeat _5-10___ times per session. Do __2__ sessions per day.  Copyright  VHI. All rights reserved.

## 2020-07-14 ENCOUNTER — Telehealth (HOSPITAL_COMMUNITY): Payer: Self-pay | Admitting: Physical Therapy

## 2020-07-14 ENCOUNTER — Ambulatory Visit (HOSPITAL_COMMUNITY): Payer: BLUE CROSS/BLUE SHIELD | Admitting: Physical Therapy

## 2020-07-14 NOTE — Telephone Encounter (Signed)
pt called this morning to cx today's appt due to she is sick

## 2020-07-19 ENCOUNTER — Encounter (HOSPITAL_COMMUNITY): Payer: BLUE CROSS/BLUE SHIELD | Admitting: Physical Therapy

## 2020-07-21 ENCOUNTER — Encounter (HOSPITAL_COMMUNITY): Payer: BLUE CROSS/BLUE SHIELD

## 2020-07-26 ENCOUNTER — Ambulatory Visit (HOSPITAL_COMMUNITY): Payer: BLUE CROSS/BLUE SHIELD | Attending: Neurology | Admitting: Physical Therapy

## 2020-07-26 ENCOUNTER — Encounter (HOSPITAL_COMMUNITY): Payer: Self-pay | Admitting: Physical Therapy

## 2020-07-26 ENCOUNTER — Other Ambulatory Visit: Payer: Self-pay

## 2020-07-26 DIAGNOSIS — R42 Dizziness and giddiness: Secondary | ICD-10-CM | POA: Diagnosis not present

## 2020-07-26 DIAGNOSIS — H8111 Benign paroxysmal vertigo, right ear: Secondary | ICD-10-CM | POA: Insufficient documentation

## 2020-07-26 DIAGNOSIS — M436 Torticollis: Secondary | ICD-10-CM | POA: Insufficient documentation

## 2020-07-26 DIAGNOSIS — M542 Cervicalgia: Secondary | ICD-10-CM

## 2020-07-26 NOTE — Therapy (Signed)
[facility] [address] [place], [place], [address] Phone: [phone]   Fax:  [phone]  Physical Therapy Treatment  Patient Details  Name: Kara Fowler MRN: [id number] Date of Birth: [birth year] Referring Provider (PT): Naomie Dean   Encounter Date: 07/26/2020   PT End of Session - 07/26/20 0904    Visit Number 4    Number of Visits 8    Date for PT Re-Evaluation 07/28/20    Authorization Type BCBS    Progress Note Due on Visit 8    PT Start Time [phone]   PT late   PT Stop Time 0920    PT Time Calculation (min) 42 min    Activity Tolerance Patient tolerated treatment well    Behavior During Therapy [facility] for tasks assessed/performed           Past Medical History:  Diagnosis Date  . GERD (gastroesophageal reflux disease) 04/15/2016  . Headache    sinus  . History of kidney stones   . Ovarian cyst   . Peptic ulcer   . PONV (postoperative nausea and vomiting)   . Sinusitis   . Tinnitus   . Vertigo     Past Surgical History:  Procedure Laterality Date  . APPENDECTOMY    . BIOPSY  05/17/2016   Procedure: BIOPSY;  Surgeon: Malissa Hippo, MD;  [facility];  Service: Endoscopy;;  gastric, duodenum  . bladder stretching  2017   dr Logan Bores  . CYSTOSCOPY/URETEROSCOPY/HOLMIUM LASER/STENT PLACEMENT Bilateral 11/12/2017   Procedure: CYSTOSCOPY/URETEROSCOPY/HOLMIUM LASER/STENT PLACEMENT;  Surgeon: Hildred Laser, MD;  Location: [facility];  Service: Urology;  Laterality: Bilateral;  . ESOPHAGOGASTRODUODENOSCOPY N/A 05/17/2016   Procedure: ESOPHAGOGASTRODUODENOSCOPY (EGD);  Surgeon: Malissa Hippo, MD;  [facility];  Service: Endoscopy;  Laterality: N/A;  2:00 - moved to 10:30 - office to notify  . KIDNEY STONE SURGERY     had stent placed    There were no vitals filed for this visit.   Subjective Assessment - 07/26/20 0838    Subjective PT states that her dizziness and nausea  has been very bad in the past couple of days.  SHe has been doing her exercises.  Pt has not taken her mexlazine due to going to work today. States that she has been struggling at work.    Currently in Pain? Yes    Pain Score 9     Pain Location Neck    Pain Orientation Lower    Pain Descriptors / Indicators Aching    Pain Onset More than a month ago    Pain Frequency Constant    Aggravating Factors  not sure    Pain Relieving Factors MHP, pain patches    Effect of Pain on Daily Activities keeps going                             [facility] Adult PT Treatment/Exercise - 07/26/20 0001      Exercises   Exercises Neck      Neck Exercises: Seated   Cervical Rotation Both;10 reps   working on tracking with eyes as well    Lateral Flexion Both;10 reps    Shoulder Shrugs 5 reps    Shoulder Rolls Backwards;5 reps    Other Seated Exercise head rolls 3 x clockwise /3 counter       Neck Exercises: Supine   Neck Retraction 10 reps  Neck Retraction Limitations scapular retraction  x 10     Other Supine Exercise eye tracking exercises for all motion       Manual Therapy   Manual Therapy Soft tissue mobilization;Manual Traction    Manual therapy comments Manual complete separate than rest of tx    Soft tissue mobilization supine position with LE elevated:  STM to cervical mm UT, scalenes, levator, paraspinals, suboccipital release and manual traction    Manual Traction to decrease pain                      PT Short Term Goals - 06/28/20 1027      PT SHORT TERM GOAL #1   Title PT to be I in HEP to allow pt to state that she is only having sx of dizziness 3 or less times a day.    Baseline at least 5    Time 2    Period Weeks    Status New    Target Date 07/12/20             PT Long Term Goals - 06/28/20 1031      PT LONG TERM GOAL #1   Title PT to be I in advance HEP to allow sx of dizziness to decrease to no more than 1 x per day.    Time 4     Period Weeks    Status New    Target Date 07/26/20      PT LONG TERM GOAL #2   Title Pt to be able to turn her head in both directions at least 70 degrees to allow safer driving    Time 4    Period Weeks    Status New      PT LONG TERM GOAL #3   Title cervical pain to be no greater than a 2/10    Time 4    Period Weeks    Status New                 Plan - 07/26/20 0905    Clinical Impression Statement Pt exacerbated in dizziness today session modified as to not increase pt sx as she comes to the department stating that she feels that she is going to become sick.  No significant mm spasms noted during manal.  No nystagmus noted during exercises. Pt noted to have difficulty with eye control with tracking.    Examination-Activity Limitations Other    Examination-Participation Restrictions Driving;Shop;Yard Work;Cleaning;Community Activity;Occupation    Stability/Clinical Decision Making Evolving/Moderate complexity    Rehab Potential Good    PT Frequency 2x / week    PT Duration 4 weeks    PT Treatment/Interventions Patient/family education;Manual techniques;Therapeutic activities;Therapeutic exercise;Other (comment)    PT Next Visit Plan Continue sitting habituation , balance  posture exercise, scapular retraction, cervical retraction. cervical stretching and manual    PT Home Exercise Plan eye tracking, cervical side bend and rotation           Patient will benefit from skilled therapeutic intervention in order to improve the following deficits and impairments:  Decreased balance, Dizziness, Decreased range of motion, Postural dysfunction, Increased muscle spasms  Visit Diagnosis: Stiffness of cervical spine  Dizziness and giddiness  BPPV (benign paroxysmal positional vertigo), right  Cervicalgia     Problem List Patient Active Problem List   Diagnosis Date Noted  . GERD (gastroesophageal reflux disease) 04/15/2016    Virgina Organ, PT  CLT [phone] 07/26/2020, 9:21  AM  [facility] [address] [place], [place], [address] Phone: [phone]   Fax:  [phone]  Name: Kara Fowler MRN: [id number] Date of Birth: 18-May-1971

## 2020-07-28 ENCOUNTER — Ambulatory Visit (HOSPITAL_COMMUNITY): Payer: BLUE CROSS/BLUE SHIELD | Admitting: Physical Therapy

## 2020-07-28 DIAGNOSIS — N3010 Interstitial cystitis (chronic) without hematuria: Secondary | ICD-10-CM | POA: Diagnosis not present

## 2020-07-28 DIAGNOSIS — N3281 Overactive bladder: Secondary | ICD-10-CM | POA: Diagnosis not present

## 2020-08-01 DIAGNOSIS — N83202 Unspecified ovarian cyst, left side: Secondary | ICD-10-CM | POA: Diagnosis not present

## 2020-08-01 DIAGNOSIS — Z6828 Body mass index (BMI) 28.0-28.9, adult: Secondary | ICD-10-CM | POA: Diagnosis not present

## 2020-08-01 DIAGNOSIS — N83292 Other ovarian cyst, left side: Secondary | ICD-10-CM | POA: Diagnosis not present

## 2020-08-02 ENCOUNTER — Ambulatory Visit (HOSPITAL_COMMUNITY): Payer: BLUE CROSS/BLUE SHIELD | Admitting: Physical Therapy

## 2020-08-02 ENCOUNTER — Telehealth (HOSPITAL_COMMUNITY): Payer: Self-pay | Admitting: Physical Therapy

## 2020-08-02 NOTE — Telephone Encounter (Signed)
pt called to cancel today's appt due to she is sick

## 2020-08-04 ENCOUNTER — Ambulatory Visit (HOSPITAL_COMMUNITY): Payer: BLUE CROSS/BLUE SHIELD | Admitting: Physical Therapy

## 2020-08-04 ENCOUNTER — Telehealth (HOSPITAL_COMMUNITY): Payer: Self-pay | Admitting: Physical Therapy

## 2020-08-04 NOTE — Telephone Encounter (Signed)
Pt called re missed appointment.  She does not have another appointment scheduled.  There was no answer, therapist left message that if pt desires to continue therapy she will need to call the office and schedule an appointment.  Virgina Organ, PT CLT [phone]

## 2020-08-18 DIAGNOSIS — R42 Dizziness and giddiness: Secondary | ICD-10-CM | POA: Diagnosis not present

## 2020-08-18 DIAGNOSIS — F419 Anxiety disorder, unspecified: Secondary | ICD-10-CM | POA: Diagnosis not present

## 2020-08-18 DIAGNOSIS — N3010 Interstitial cystitis (chronic) without hematuria: Secondary | ICD-10-CM | POA: Diagnosis not present

## 2020-08-18 DIAGNOSIS — Z6828 Body mass index (BMI) 28.0-28.9, adult: Secondary | ICD-10-CM | POA: Diagnosis not present

## 2020-08-25 DIAGNOSIS — K589 Irritable bowel syndrome without diarrhea: Secondary | ICD-10-CM | POA: Diagnosis not present

## 2020-08-25 DIAGNOSIS — N939 Abnormal uterine and vaginal bleeding, unspecified: Secondary | ICD-10-CM | POA: Diagnosis not present

## 2020-08-25 DIAGNOSIS — N3281 Overactive bladder: Secondary | ICD-10-CM | POA: Diagnosis not present

## 2020-08-25 DIAGNOSIS — N3010 Interstitial cystitis (chronic) without hematuria: Secondary | ICD-10-CM | POA: Diagnosis not present

## 2020-08-25 DIAGNOSIS — N83292 Other ovarian cyst, left side: Secondary | ICD-10-CM | POA: Diagnosis not present

## 2020-08-25 DIAGNOSIS — R102 Pelvic and perineal pain: Secondary | ICD-10-CM | POA: Diagnosis not present

## 2020-08-25 DIAGNOSIS — F32A Depression, unspecified: Secondary | ICD-10-CM | POA: Diagnosis not present

## 2020-08-25 DIAGNOSIS — N83202 Unspecified ovarian cyst, left side: Secondary | ICD-10-CM | POA: Diagnosis not present

## 2020-08-25 DIAGNOSIS — F419 Anxiety disorder, unspecified: Secondary | ICD-10-CM | POA: Diagnosis not present

## 2020-08-25 DIAGNOSIS — K219 Gastro-esophageal reflux disease without esophagitis: Secondary | ICD-10-CM | POA: Diagnosis not present

## 2020-09-04 DIAGNOSIS — D225 Melanocytic nevi of trunk: Secondary | ICD-10-CM | POA: Diagnosis not present

## 2020-09-04 DIAGNOSIS — L570 Actinic keratosis: Secondary | ICD-10-CM | POA: Diagnosis not present

## 2020-09-04 DIAGNOSIS — D485 Neoplasm of uncertain behavior of skin: Secondary | ICD-10-CM | POA: Diagnosis not present

## 2020-09-28 DIAGNOSIS — D485 Neoplasm of uncertain behavior of skin: Secondary | ICD-10-CM | POA: Diagnosis not present

## 2020-09-28 DIAGNOSIS — L988 Other specified disorders of the skin and subcutaneous tissue: Secondary | ICD-10-CM | POA: Diagnosis not present

## 2020-10-04 DIAGNOSIS — R350 Frequency of micturition: Secondary | ICD-10-CM | POA: Diagnosis not present

## 2020-10-04 DIAGNOSIS — Z09 Encounter for follow-up examination after completed treatment for conditions other than malignant neoplasm: Secondary | ICD-10-CM | POA: Diagnosis not present

## 2020-10-04 DIAGNOSIS — N3010 Interstitial cystitis (chronic) without hematuria: Secondary | ICD-10-CM | POA: Diagnosis not present

## 2020-10-27 DIAGNOSIS — U071 COVID-19: Secondary | ICD-10-CM | POA: Diagnosis not present

## 2020-11-20 ENCOUNTER — Encounter (HOSPITAL_COMMUNITY): Payer: Self-pay | Admitting: Physical Therapy

## 2020-11-20 NOTE — Therapy (Signed)
[facility] [address] [place], [place], [address] Phone: [phone]   Fax:  [phone]  Physical Therapy Treatment  Patient Details  Name: Kara Fowler MRN: [id number] Date of Birth: 05/21/1971 Referring Provider (PT): Sarina Ill   Encounter Date: 11/20/2020   PHYSICAL THERAPY DISCHARGE SUMMARY  Visits from Start of Care: 4  Current functional level related to goals / functional outcomes: Unknown as pt did not return      Remaining deficits: Unknown as pt did not return      Education / Equipment: HEP Plan: Patient agrees to discharge.  Patient goals were not met. Patient is being discharged due to not returning since the last visit.  ?????    Rayetta Humphrey, PT CLT [phone] 11/20/2020, 10:59 AM  [place], [place], [address] Phone: [phone]   Fax:  [phone]  Name: Kara Fowler MRN: [id number] Date of Birth: 12-31-1970

## 2020-12-11 ENCOUNTER — Ambulatory Visit: Payer: BLUE CROSS/BLUE SHIELD | Admitting: Physical Therapy

## 2021-04-12 ENCOUNTER — Other Ambulatory Visit (INDEPENDENT_AMBULATORY_CARE_PROVIDER_SITE_OTHER): Payer: Self-pay

## 2021-04-12 DIAGNOSIS — K219 Gastro-esophageal reflux disease without esophagitis: Secondary | ICD-10-CM

## 2021-04-12 MED ORDER — PANTOPRAZOLE SODIUM 40 MG PO TBEC: 40.0000 mg | DELAYED_RELEASE_TABLET | Freq: Every day | ORAL | 0 refills | Status: AC

## 2021-06-08 DIAGNOSIS — K219 Gastro-esophageal reflux disease without esophagitis: Secondary | ICD-10-CM | POA: Diagnosis not present

## 2021-06-08 DIAGNOSIS — F419 Anxiety disorder, unspecified: Secondary | ICD-10-CM | POA: Diagnosis not present

## 2021-06-08 DIAGNOSIS — N3010 Interstitial cystitis (chronic) without hematuria: Secondary | ICD-10-CM | POA: Diagnosis not present

## 2021-06-08 DIAGNOSIS — R42 Dizziness and giddiness: Secondary | ICD-10-CM | POA: Diagnosis not present

## 2021-06-28 DIAGNOSIS — Z1211 Encounter for screening for malignant neoplasm of colon: Secondary | ICD-10-CM | POA: Diagnosis not present

## 2021-06-28 DIAGNOSIS — Z1212 Encounter for screening for malignant neoplasm of rectum: Secondary | ICD-10-CM | POA: Diagnosis not present

## 2021-09-04 IMAGING — MG DIGITAL SCREENING BILAT W/ TOMO W/ CAD
8 series · 9 of 24 positions shown · non-contrast
Comparison: Previous exam(s).

CLINICAL DATA: Screening. This is the patient's initial baseline
mammogram.

EXAM:
DIGITAL SCREENING BILATERAL MAMMOGRAM WITH TOMO AND CAD

[R MLO synth-2D]
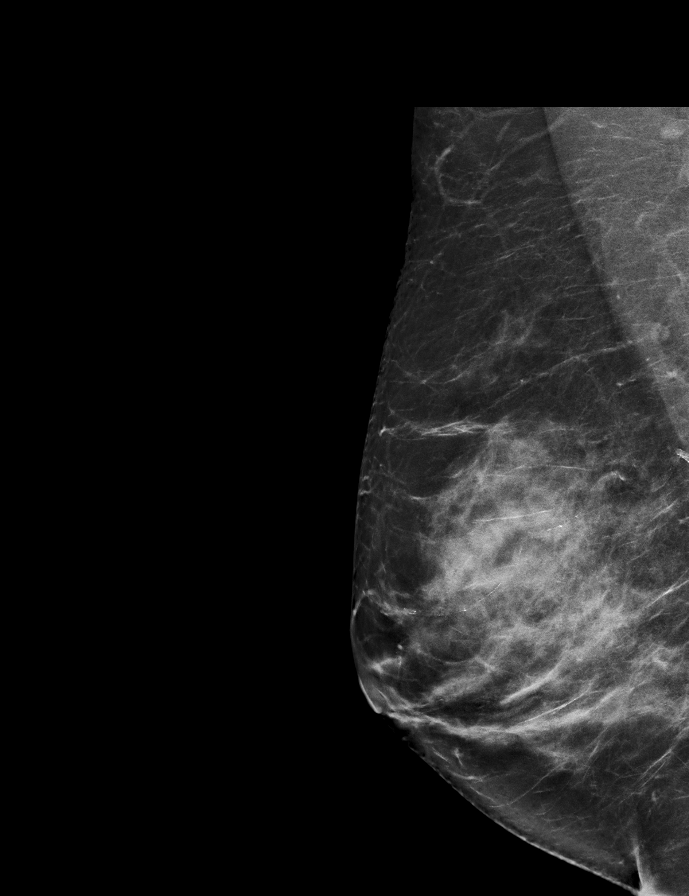

[R CC synth-2D]
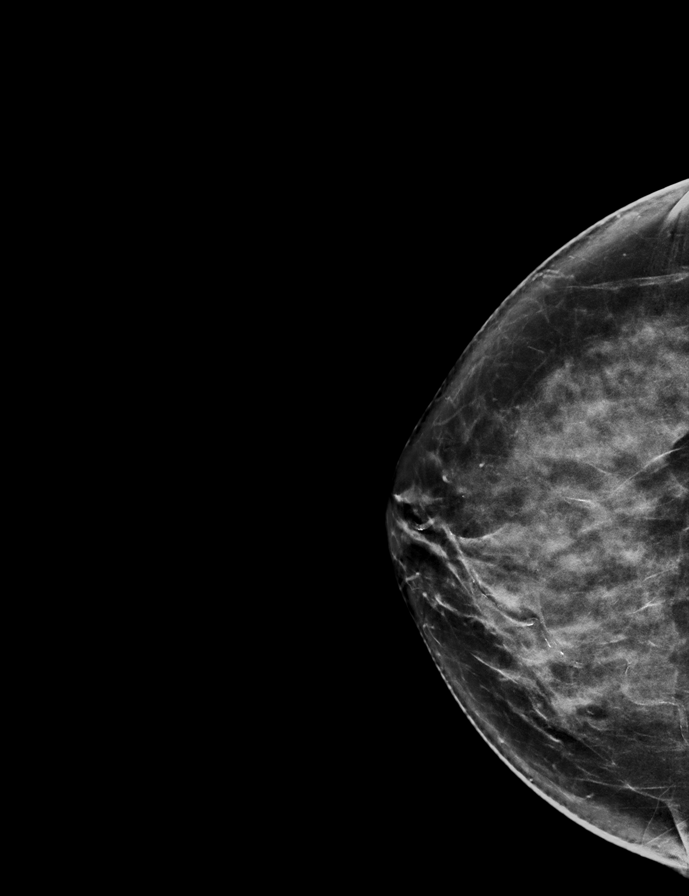

[L CC synth-2D]
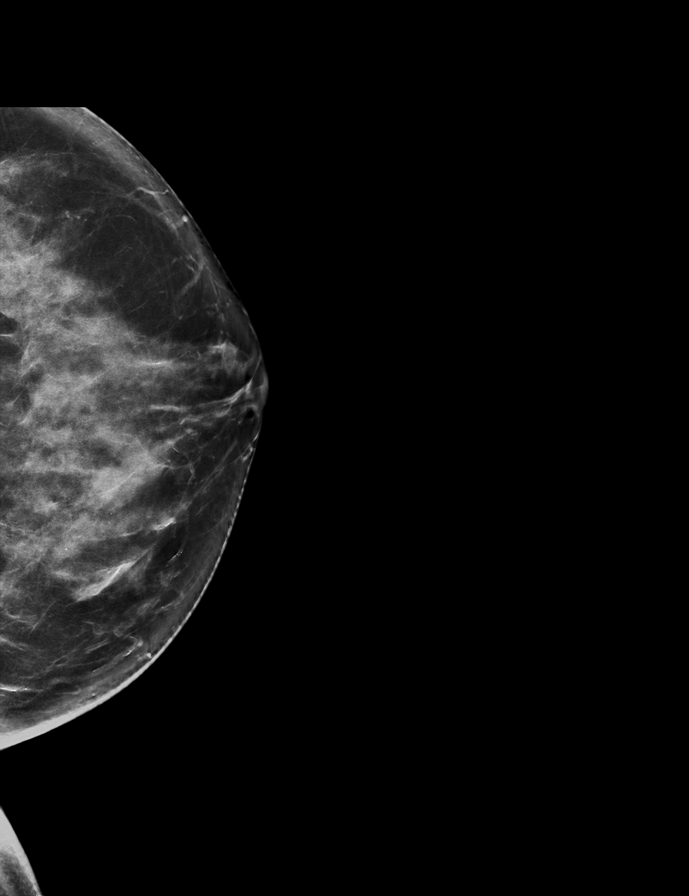

[L MLO synth-2D]
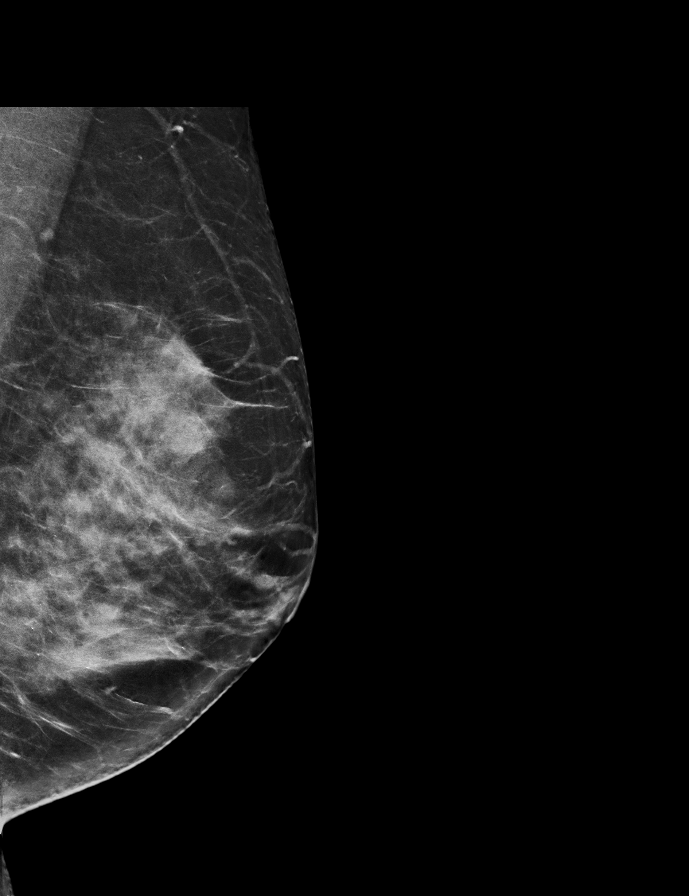

[R CC tomo · 2 of 69 frames shown]
[frame 23/69]
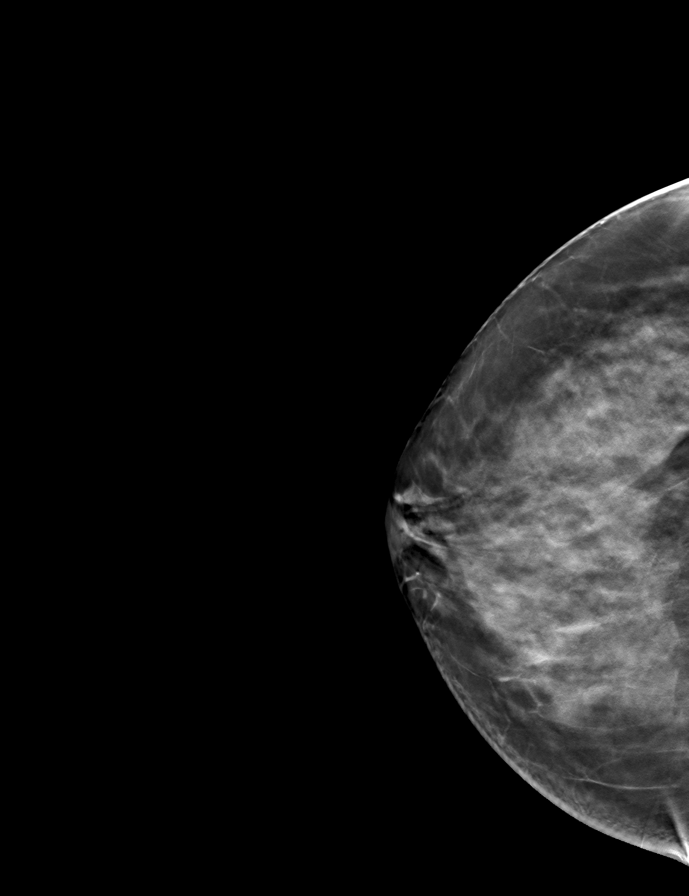
[frame 35/69]
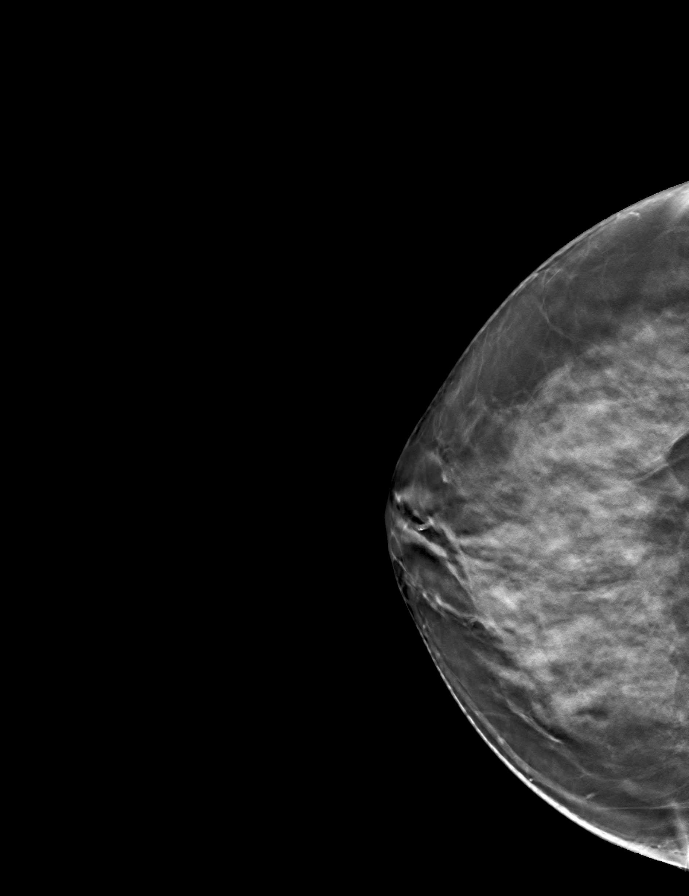

[L MLO tomo · tomo slice 36/71.0]
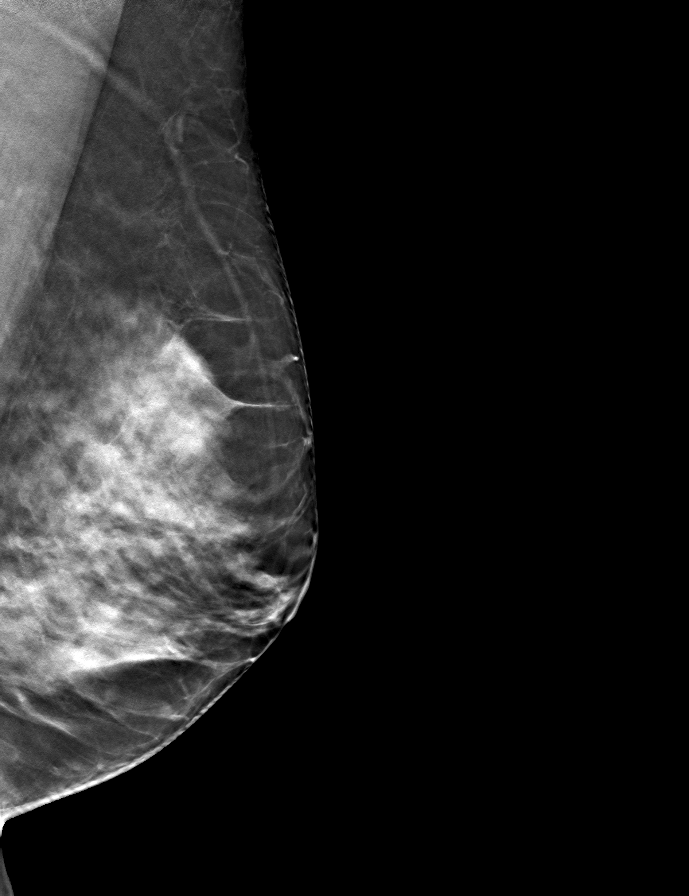

[R MLO tomo · tomo slice 37/74.0]
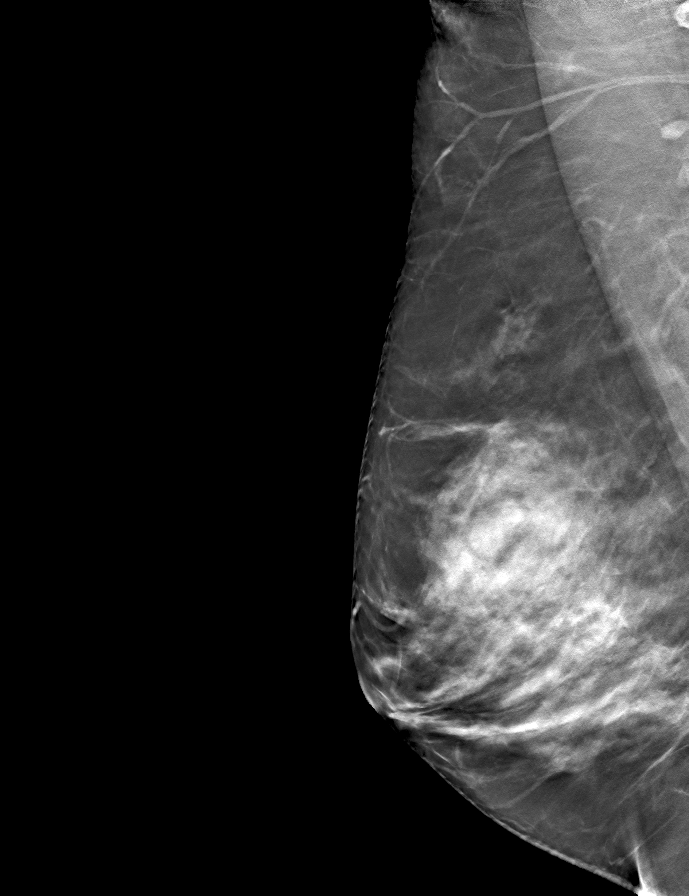

[L CC tomo · tomo slice 37/73.0]
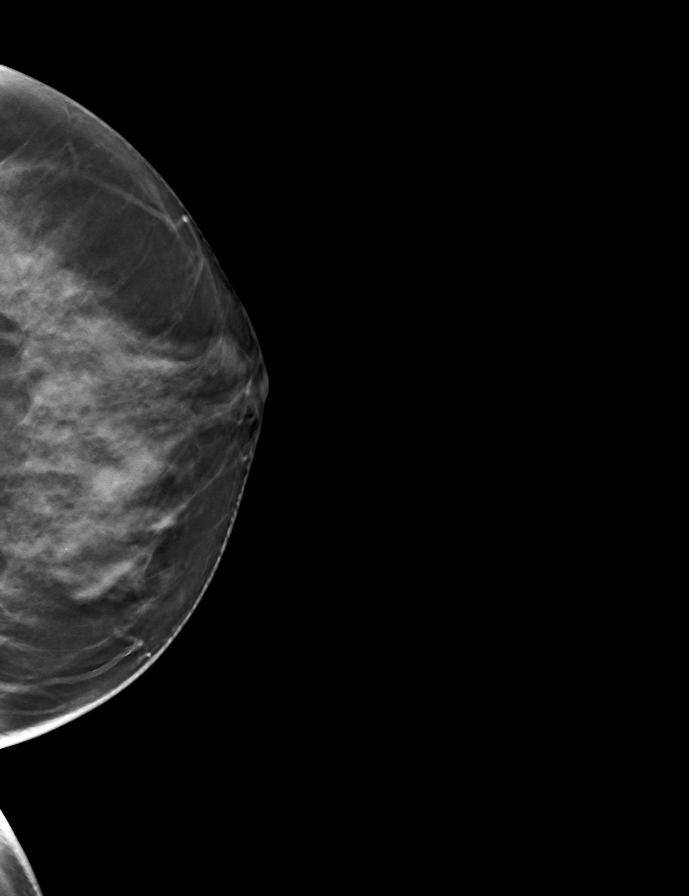

[9 of 24 positions shown; findings below may reference images not displayed]

ACR Breast Density Category d: The breast tissue is extremely dense,
which lowers the sensitivity of mammography.
FINDINGS: In the left breast, a possible mass warrants further evaluation. In
the right breast, no findings suspicious for malignancy.

Images were processed with CAD.
IMPRESSION: Further evaluation is suggested for possible mass in the left
breast.

RECOMMENDATION:
Diagnostic mammogram and possibly ultrasound of the left breast.
(Code:SQ-[id number])

The patient will be contacted regarding the findings, and additional
imaging will be scheduled.

BI-RADS CATEGORY  0: Incomplete. Need additional imaging evaluation
and/or prior mammograms for comparison.

## 2021-11-30 DIAGNOSIS — R0989 Other specified symptoms and signs involving the circulatory and respiratory systems: Secondary | ICD-10-CM | POA: Diagnosis not present

## 2021-11-30 DIAGNOSIS — Z20822 Contact with and (suspected) exposure to covid-19: Secondary | ICD-10-CM | POA: Diagnosis not present

## 2021-11-30 DIAGNOSIS — R6889 Other general symptoms and signs: Secondary | ICD-10-CM | POA: Diagnosis not present

## 2021-11-30 DIAGNOSIS — R051 Acute cough: Secondary | ICD-10-CM | POA: Diagnosis not present

## 2021-11-30 DIAGNOSIS — R509 Fever, unspecified: Secondary | ICD-10-CM | POA: Diagnosis not present

## 2021-12-20 DIAGNOSIS — J209 Acute bronchitis, unspecified: Secondary | ICD-10-CM | POA: Diagnosis not present

## 2021-12-20 DIAGNOSIS — J3089 Other allergic rhinitis: Secondary | ICD-10-CM | POA: Diagnosis not present

## 2021-12-20 DIAGNOSIS — J069 Acute upper respiratory infection, unspecified: Secondary | ICD-10-CM | POA: Diagnosis not present

## 2021-12-20 DIAGNOSIS — F419 Anxiety disorder, unspecified: Secondary | ICD-10-CM | POA: Diagnosis not present

## 2021-12-21 DIAGNOSIS — J3089 Other allergic rhinitis: Secondary | ICD-10-CM | POA: Diagnosis not present

## 2021-12-21 DIAGNOSIS — J209 Acute bronchitis, unspecified: Secondary | ICD-10-CM | POA: Diagnosis not present

## 2021-12-21 DIAGNOSIS — J069 Acute upper respiratory infection, unspecified: Secondary | ICD-10-CM | POA: Diagnosis not present

## 2022-01-01 DIAGNOSIS — Z1231 Encounter for screening mammogram for malignant neoplasm of breast: Secondary | ICD-10-CM | POA: Diagnosis not present

## 2022-01-01 DIAGNOSIS — E785 Hyperlipidemia, unspecified: Secondary | ICD-10-CM | POA: Diagnosis not present

## 2022-01-01 DIAGNOSIS — R42 Dizziness and giddiness: Secondary | ICD-10-CM | POA: Diagnosis not present

## 2022-01-01 DIAGNOSIS — Z0000 Encounter for general adult medical examination without abnormal findings: Secondary | ICD-10-CM | POA: Diagnosis not present

## 2022-01-01 DIAGNOSIS — Z1283 Encounter for screening for malignant neoplasm of skin: Secondary | ICD-10-CM | POA: Diagnosis not present

## 2022-01-01 DIAGNOSIS — Z7689 Persons encountering health services in other specified circumstances: Secondary | ICD-10-CM | POA: Diagnosis not present

## 2022-01-01 DIAGNOSIS — M542 Cervicalgia: Secondary | ICD-10-CM | POA: Diagnosis not present

## 2022-01-09 DIAGNOSIS — Z20822 Contact with and (suspected) exposure to covid-19: Secondary | ICD-10-CM | POA: Diagnosis not present

## 2022-01-09 DIAGNOSIS — R051 Acute cough: Secondary | ICD-10-CM | POA: Diagnosis not present

## 2022-01-09 DIAGNOSIS — J029 Acute pharyngitis, unspecified: Secondary | ICD-10-CM | POA: Diagnosis not present

## 2022-01-09 DIAGNOSIS — R0989 Other specified symptoms and signs involving the circulatory and respiratory systems: Secondary | ICD-10-CM | POA: Diagnosis not present

## 2022-01-09 DIAGNOSIS — Z1152 Encounter for screening for COVID-19: Secondary | ICD-10-CM | POA: Diagnosis not present

## 2022-01-09 DIAGNOSIS — J189 Pneumonia, unspecified organism: Secondary | ICD-10-CM | POA: Diagnosis not present

## 2022-01-24 DIAGNOSIS — Z1231 Encounter for screening mammogram for malignant neoplasm of breast: Secondary | ICD-10-CM | POA: Diagnosis not present

## 2022-02-04 DIAGNOSIS — R42 Dizziness and giddiness: Secondary | ICD-10-CM | POA: Diagnosis not present

## 2022-02-04 DIAGNOSIS — R102 Pelvic and perineal pain: Secondary | ICD-10-CM | POA: Diagnosis not present

## 2022-02-04 DIAGNOSIS — R042 Hemoptysis: Secondary | ICD-10-CM | POA: Diagnosis not present

## 2022-02-04 DIAGNOSIS — K920 Hematemesis: Secondary | ICD-10-CM | POA: Diagnosis not present

## 2022-02-06 DIAGNOSIS — H814 Vertigo of central origin: Secondary | ICD-10-CM | POA: Diagnosis not present

## 2022-02-06 DIAGNOSIS — M5412 Radiculopathy, cervical region: Secondary | ICD-10-CM | POA: Diagnosis not present

## 2022-02-12 DIAGNOSIS — R042 Hemoptysis: Secondary | ICD-10-CM | POA: Diagnosis not present

## 2022-02-12 DIAGNOSIS — K920 Hematemesis: Secondary | ICD-10-CM | POA: Diagnosis not present

## 2022-02-12 DIAGNOSIS — R928 Other abnormal and inconclusive findings on diagnostic imaging of breast: Secondary | ICD-10-CM | POA: Diagnosis not present

## 2022-02-12 DIAGNOSIS — R102 Pelvic and perineal pain: Secondary | ICD-10-CM | POA: Diagnosis not present

## 2022-02-13 DIAGNOSIS — L814 Other melanin hyperpigmentation: Secondary | ICD-10-CM | POA: Diagnosis not present

## 2022-02-13 DIAGNOSIS — D2271 Melanocytic nevi of right lower limb, including hip: Secondary | ICD-10-CM | POA: Diagnosis not present

## 2022-02-13 DIAGNOSIS — D485 Neoplasm of uncertain behavior of skin: Secondary | ICD-10-CM | POA: Diagnosis not present

## 2022-02-13 DIAGNOSIS — Z7189 Other specified counseling: Secondary | ICD-10-CM | POA: Diagnosis not present

## 2022-02-13 DIAGNOSIS — I788 Other diseases of capillaries: Secondary | ICD-10-CM | POA: Diagnosis not present

## 2022-02-21 DIAGNOSIS — K920 Hematemesis: Secondary | ICD-10-CM | POA: Diagnosis not present

## 2022-02-21 DIAGNOSIS — N200 Calculus of kidney: Secondary | ICD-10-CM | POA: Diagnosis not present

## 2022-02-21 DIAGNOSIS — R152 Fecal urgency: Secondary | ICD-10-CM | POA: Diagnosis not present

## 2022-02-21 DIAGNOSIS — R933 Abnormal findings on diagnostic imaging of other parts of digestive tract: Secondary | ICD-10-CM | POA: Diagnosis not present

## 2022-02-21 DIAGNOSIS — R1310 Dysphagia, unspecified: Secondary | ICD-10-CM | POA: Diagnosis not present

## 2022-02-21 DIAGNOSIS — R159 Full incontinence of feces: Secondary | ICD-10-CM | POA: Diagnosis not present

## 2022-02-21 DIAGNOSIS — R1084 Generalized abdominal pain: Secondary | ICD-10-CM | POA: Diagnosis not present

## 2022-02-21 DIAGNOSIS — K219 Gastro-esophageal reflux disease without esophagitis: Secondary | ICD-10-CM | POA: Diagnosis not present

## 2022-02-26 DIAGNOSIS — M5412 Radiculopathy, cervical region: Secondary | ICD-10-CM | POA: Diagnosis not present

## 2022-03-04 DIAGNOSIS — R8290 Unspecified abnormal findings in urine: Secondary | ICD-10-CM | POA: Diagnosis not present

## 2022-03-04 DIAGNOSIS — N3281 Overactive bladder: Secondary | ICD-10-CM | POA: Diagnosis not present

## 2022-03-04 DIAGNOSIS — R3989 Other symptoms and signs involving the genitourinary system: Secondary | ICD-10-CM | POA: Diagnosis not present

## 2022-03-05 DIAGNOSIS — R8290 Unspecified abnormal findings in urine: Secondary | ICD-10-CM | POA: Diagnosis not present

## 2022-03-06 DIAGNOSIS — M79602 Pain in left arm: Secondary | ICD-10-CM | POA: Diagnosis not present

## 2022-03-06 DIAGNOSIS — M542 Cervicalgia: Secondary | ICD-10-CM | POA: Diagnosis not present

## 2022-03-14 DIAGNOSIS — N200 Calculus of kidney: Secondary | ICD-10-CM | POA: Diagnosis not present

## 2022-03-14 DIAGNOSIS — R8290 Unspecified abnormal findings in urine: Secondary | ICD-10-CM | POA: Diagnosis not present

## 2022-03-22 DIAGNOSIS — M47812 Spondylosis without myelopathy or radiculopathy, cervical region: Secondary | ICD-10-CM | POA: Diagnosis not present

## 2022-03-22 DIAGNOSIS — M4602 Spinal enthesopathy, cervical region: Secondary | ICD-10-CM | POA: Diagnosis not present

## 2022-04-04 DIAGNOSIS — Z886 Allergy status to analgesic agent status: Secondary | ICD-10-CM | POA: Diagnosis not present

## 2022-04-04 DIAGNOSIS — K2970 Gastritis, unspecified, without bleeding: Secondary | ICD-10-CM | POA: Diagnosis not present

## 2022-04-04 DIAGNOSIS — K640 First degree hemorrhoids: Secondary | ICD-10-CM | POA: Diagnosis not present

## 2022-04-04 DIAGNOSIS — K2100 Gastro-esophageal reflux disease with esophagitis, without bleeding: Secondary | ICD-10-CM | POA: Diagnosis not present

## 2022-04-04 DIAGNOSIS — K920 Hematemesis: Secondary | ICD-10-CM | POA: Diagnosis not present

## 2022-04-04 DIAGNOSIS — K222 Esophageal obstruction: Secondary | ICD-10-CM | POA: Diagnosis not present

## 2022-04-04 DIAGNOSIS — Z1211 Encounter for screening for malignant neoplasm of colon: Secondary | ICD-10-CM | POA: Diagnosis not present

## 2022-04-04 DIAGNOSIS — K648 Other hemorrhoids: Secondary | ICD-10-CM | POA: Diagnosis not present

## 2022-04-04 DIAGNOSIS — K449 Diaphragmatic hernia without obstruction or gangrene: Secondary | ICD-10-CM | POA: Diagnosis not present

## 2022-04-04 DIAGNOSIS — R9389 Abnormal findings on diagnostic imaging of other specified body structures: Secondary | ICD-10-CM | POA: Diagnosis not present

## 2022-04-04 DIAGNOSIS — R1084 Generalized abdominal pain: Secondary | ICD-10-CM | POA: Diagnosis not present

## 2022-04-04 DIAGNOSIS — Z79899 Other long term (current) drug therapy: Secondary | ICD-10-CM | POA: Diagnosis not present

## 2022-04-04 DIAGNOSIS — K209 Esophagitis, unspecified without bleeding: Secondary | ICD-10-CM | POA: Diagnosis not present

## 2022-04-04 DIAGNOSIS — K2960 Other gastritis without bleeding: Secondary | ICD-10-CM | POA: Diagnosis not present

## 2022-04-04 DIAGNOSIS — K208 Other esophagitis without bleeding: Secondary | ICD-10-CM | POA: Diagnosis not present

## 2022-04-04 DIAGNOSIS — K229 Disease of esophagus, unspecified: Secondary | ICD-10-CM | POA: Diagnosis not present

## 2022-04-04 DIAGNOSIS — K317 Polyp of stomach and duodenum: Secondary | ICD-10-CM | POA: Diagnosis not present

## 2022-04-04 DIAGNOSIS — Z7951 Long term (current) use of inhaled steroids: Secondary | ICD-10-CM | POA: Diagnosis not present

## 2022-04-04 DIAGNOSIS — K2950 Unspecified chronic gastritis without bleeding: Secondary | ICD-10-CM | POA: Diagnosis not present

## 2022-04-04 DIAGNOSIS — R109 Unspecified abdominal pain: Secondary | ICD-10-CM | POA: Diagnosis not present

## 2022-04-09 DIAGNOSIS — R202 Paresthesia of skin: Secondary | ICD-10-CM | POA: Diagnosis not present

## 2022-04-25 DIAGNOSIS — M542 Cervicalgia: Secondary | ICD-10-CM | POA: Diagnosis not present

## 2022-04-30 DIAGNOSIS — M542 Cervicalgia: Secondary | ICD-10-CM | POA: Diagnosis not present

## 2022-04-30 DIAGNOSIS — H8101 Meniere's disease, right ear: Secondary | ICD-10-CM | POA: Diagnosis not present

## 2022-04-30 DIAGNOSIS — I671 Cerebral aneurysm, nonruptured: Secondary | ICD-10-CM | POA: Diagnosis not present

## 2022-05-01 DIAGNOSIS — R3989 Other symptoms and signs involving the genitourinary system: Secondary | ICD-10-CM | POA: Diagnosis not present

## 2022-05-01 DIAGNOSIS — N3281 Overactive bladder: Secondary | ICD-10-CM | POA: Diagnosis not present

## 2022-05-01 DIAGNOSIS — Z90710 Acquired absence of both cervix and uterus: Secondary | ICD-10-CM | POA: Diagnosis not present

## 2022-05-03 DIAGNOSIS — M4602 Spinal enthesopathy, cervical region: Secondary | ICD-10-CM | POA: Diagnosis not present

## 2022-05-03 DIAGNOSIS — M47812 Spondylosis without myelopathy or radiculopathy, cervical region: Secondary | ICD-10-CM | POA: Diagnosis not present

## 2022-05-20 DIAGNOSIS — Z0271 Encounter for disability determination: Secondary | ICD-10-CM

## 2022-05-22 DIAGNOSIS — R1310 Dysphagia, unspecified: Secondary | ICD-10-CM | POA: Diagnosis not present

## 2022-05-22 DIAGNOSIS — K581 Irritable bowel syndrome with constipation: Secondary | ICD-10-CM | POA: Diagnosis not present

## 2022-05-22 DIAGNOSIS — K219 Gastro-esophageal reflux disease without esophagitis: Secondary | ICD-10-CM | POA: Diagnosis not present

## 2022-05-22 DIAGNOSIS — K760 Fatty (change of) liver, not elsewhere classified: Secondary | ICD-10-CM | POA: Diagnosis not present

## 2022-06-13 DIAGNOSIS — M25511 Pain in right shoulder: Secondary | ICD-10-CM | POA: Diagnosis not present

## 2022-06-13 DIAGNOSIS — M7550 Bursitis of unspecified shoulder: Secondary | ICD-10-CM | POA: Diagnosis not present

## 2022-06-13 DIAGNOSIS — M7590 Shoulder lesion, unspecified, unspecified shoulder: Secondary | ICD-10-CM | POA: Diagnosis not present

## 2022-06-20 DIAGNOSIS — H9313 Tinnitus, bilateral: Secondary | ICD-10-CM | POA: Diagnosis not present

## 2022-06-20 DIAGNOSIS — H903 Sensorineural hearing loss, bilateral: Secondary | ICD-10-CM | POA: Diagnosis not present

## 2022-06-20 DIAGNOSIS — H814 Vertigo of central origin: Secondary | ICD-10-CM | POA: Diagnosis not present

## 2022-06-26 DIAGNOSIS — M7550 Bursitis of unspecified shoulder: Secondary | ICD-10-CM | POA: Diagnosis not present

## 2022-06-26 DIAGNOSIS — M25511 Pain in right shoulder: Secondary | ICD-10-CM | POA: Diagnosis not present

## 2022-06-26 DIAGNOSIS — M7590 Shoulder lesion, unspecified, unspecified shoulder: Secondary | ICD-10-CM | POA: Diagnosis not present

## 2022-07-11 DIAGNOSIS — M25511 Pain in right shoulder: Secondary | ICD-10-CM | POA: Diagnosis not present

## 2022-07-12 DIAGNOSIS — H8101 Meniere's disease, right ear: Secondary | ICD-10-CM | POA: Diagnosis not present

## 2022-07-16 DIAGNOSIS — R3989 Other symptoms and signs involving the genitourinary system: Secondary | ICD-10-CM | POA: Diagnosis not present

## 2022-07-16 DIAGNOSIS — R3982 Chronic bladder pain: Secondary | ICD-10-CM | POA: Diagnosis not present

## 2022-07-22 DIAGNOSIS — M7501 Adhesive capsulitis of right shoulder: Secondary | ICD-10-CM | POA: Diagnosis not present

## 2022-07-22 DIAGNOSIS — M25511 Pain in right shoulder: Secondary | ICD-10-CM | POA: Diagnosis not present

## 2022-07-23 DIAGNOSIS — N3281 Overactive bladder: Secondary | ICD-10-CM | POA: Diagnosis not present

## 2022-07-23 DIAGNOSIS — M6289 Other specified disorders of muscle: Secondary | ICD-10-CM | POA: Diagnosis not present

## 2022-07-24 DIAGNOSIS — M25511 Pain in right shoulder: Secondary | ICD-10-CM | POA: Diagnosis not present

## 2022-07-24 DIAGNOSIS — M7501 Adhesive capsulitis of right shoulder: Secondary | ICD-10-CM | POA: Diagnosis not present

## 2022-07-29 DIAGNOSIS — H814 Vertigo of central origin: Secondary | ICD-10-CM | POA: Diagnosis not present

## 2022-07-30 DIAGNOSIS — M7501 Adhesive capsulitis of right shoulder: Secondary | ICD-10-CM | POA: Diagnosis not present

## 2022-07-30 DIAGNOSIS — M25511 Pain in right shoulder: Secondary | ICD-10-CM | POA: Diagnosis not present

## 2022-08-01 DIAGNOSIS — M7501 Adhesive capsulitis of right shoulder: Secondary | ICD-10-CM | POA: Diagnosis not present

## 2022-08-01 DIAGNOSIS — M25511 Pain in right shoulder: Secondary | ICD-10-CM | POA: Diagnosis not present

## 2022-08-06 DIAGNOSIS — M25511 Pain in right shoulder: Secondary | ICD-10-CM | POA: Diagnosis not present

## 2022-08-06 DIAGNOSIS — M7501 Adhesive capsulitis of right shoulder: Secondary | ICD-10-CM | POA: Diagnosis not present

## 2022-08-08 DIAGNOSIS — M7501 Adhesive capsulitis of right shoulder: Secondary | ICD-10-CM | POA: Diagnosis not present

## 2022-08-08 DIAGNOSIS — M25511 Pain in right shoulder: Secondary | ICD-10-CM | POA: Diagnosis not present

## 2022-08-13 DIAGNOSIS — M7501 Adhesive capsulitis of right shoulder: Secondary | ICD-10-CM | POA: Diagnosis not present

## 2022-08-13 DIAGNOSIS — M25511 Pain in right shoulder: Secondary | ICD-10-CM | POA: Diagnosis not present

## 2022-08-14 DIAGNOSIS — N3281 Overactive bladder: Secondary | ICD-10-CM | POA: Diagnosis not present

## 2022-08-14 DIAGNOSIS — M6289 Other specified disorders of muscle: Secondary | ICD-10-CM | POA: Diagnosis not present

## 2022-08-15 DIAGNOSIS — M25511 Pain in right shoulder: Secondary | ICD-10-CM | POA: Diagnosis not present

## 2022-08-15 DIAGNOSIS — M7501 Adhesive capsulitis of right shoulder: Secondary | ICD-10-CM | POA: Diagnosis not present

## 2022-08-16 DIAGNOSIS — L02426 Furuncle of left lower limb: Secondary | ICD-10-CM | POA: Diagnosis not present

## 2022-08-16 DIAGNOSIS — D225 Melanocytic nevi of trunk: Secondary | ICD-10-CM | POA: Diagnosis not present

## 2022-08-16 DIAGNOSIS — L218 Other seborrheic dermatitis: Secondary | ICD-10-CM | POA: Diagnosis not present

## 2022-08-16 DIAGNOSIS — L814 Other melanin hyperpigmentation: Secondary | ICD-10-CM | POA: Diagnosis not present

## 2022-08-20 DIAGNOSIS — M7501 Adhesive capsulitis of right shoulder: Secondary | ICD-10-CM | POA: Diagnosis not present

## 2022-08-20 DIAGNOSIS — M25511 Pain in right shoulder: Secondary | ICD-10-CM | POA: Diagnosis not present

## 2022-08-21 DIAGNOSIS — R928 Other abnormal and inconclusive findings on diagnostic imaging of breast: Secondary | ICD-10-CM | POA: Diagnosis not present

## 2022-08-21 DIAGNOSIS — N6001 Solitary cyst of right breast: Secondary | ICD-10-CM | POA: Diagnosis not present

## 2022-08-21 DIAGNOSIS — N6315 Unspecified lump in the right breast, overlapping quadrants: Secondary | ICD-10-CM | POA: Diagnosis not present

## 2022-09-04 DIAGNOSIS — M6289 Other specified disorders of muscle: Secondary | ICD-10-CM | POA: Diagnosis not present

## 2022-09-04 DIAGNOSIS — N3281 Overactive bladder: Secondary | ICD-10-CM | POA: Diagnosis not present

## 2022-09-05 DIAGNOSIS — E785 Hyperlipidemia, unspecified: Secondary | ICD-10-CM | POA: Diagnosis not present

## 2022-09-05 DIAGNOSIS — M7550 Bursitis of unspecified shoulder: Secondary | ICD-10-CM | POA: Diagnosis not present

## 2022-09-05 DIAGNOSIS — N3281 Overactive bladder: Secondary | ICD-10-CM | POA: Diagnosis not present

## 2022-09-05 DIAGNOSIS — G629 Polyneuropathy, unspecified: Secondary | ICD-10-CM | POA: Diagnosis not present

## 2022-09-05 DIAGNOSIS — M7590 Shoulder lesion, unspecified, unspecified shoulder: Secondary | ICD-10-CM | POA: Diagnosis not present

## 2022-09-05 DIAGNOSIS — F411 Generalized anxiety disorder: Secondary | ICD-10-CM | POA: Diagnosis not present

## 2022-09-14 DIAGNOSIS — U071 COVID-19: Secondary | ICD-10-CM | POA: Diagnosis not present

## 2022-09-23 DIAGNOSIS — H81393 Other peripheral vertigo, bilateral: Secondary | ICD-10-CM | POA: Diagnosis not present

## 2022-09-25 DIAGNOSIS — N3281 Overactive bladder: Secondary | ICD-10-CM | POA: Diagnosis not present

## 2022-09-25 DIAGNOSIS — M6289 Other specified disorders of muscle: Secondary | ICD-10-CM | POA: Diagnosis not present

## 2022-10-02 DIAGNOSIS — M6289 Other specified disorders of muscle: Secondary | ICD-10-CM | POA: Diagnosis not present

## 2022-10-02 DIAGNOSIS — N3281 Overactive bladder: Secondary | ICD-10-CM | POA: Diagnosis not present

## 2022-10-03 DIAGNOSIS — H6982 Other specified disorders of Eustachian tube, left ear: Secondary | ICD-10-CM | POA: Diagnosis not present

## 2022-10-03 DIAGNOSIS — H8102 Meniere's disease, left ear: Secondary | ICD-10-CM | POA: Diagnosis not present

## 2022-10-03 DIAGNOSIS — H70002 Acute mastoiditis without complications, left ear: Secondary | ICD-10-CM | POA: Diagnosis not present

## 2022-10-11 ENCOUNTER — Encounter (HOSPITAL_COMMUNITY): Payer: Self-pay | Admitting: Emergency Medicine

## 2022-10-11 ENCOUNTER — Emergency Department (HOSPITAL_COMMUNITY)
Admission: EM | Admit: 2022-10-11 | Discharge: 2022-10-11 | Disposition: A | Discharge status: Home / Self Care | Payer: BC Managed Care – PPO | Attending: Emergency Medicine | Admitting: Emergency Medicine

## 2022-10-11 ENCOUNTER — Emergency Department (HOSPITAL_COMMUNITY): Payer: BC Managed Care – PPO

## 2022-10-11 ENCOUNTER — Other Ambulatory Visit: Payer: Self-pay

## 2022-10-11 DIAGNOSIS — N139 Obstructive and reflux uropathy, unspecified: Secondary | ICD-10-CM | POA: Insufficient documentation

## 2022-10-11 DIAGNOSIS — R109 Unspecified abdominal pain: Secondary | ICD-10-CM

## 2022-10-11 DIAGNOSIS — R1031 Right lower quadrant pain: Secondary | ICD-10-CM | POA: Diagnosis not present

## 2022-10-11 LAB — CBC WITH DIFFERENTIAL/PLATELET
Abs Immature Granulocytes: 0.01 10*3/uL (ref 0.00–0.07)
Basophils Absolute: 0.1 10*3/uL (ref 0.0–0.1)
Basophils Relative: 1 %
Eosinophils Absolute: 0 10*3/uL (ref 0.0–0.5)
Eosinophils Relative: 0 %
HCT: 40.1 % (ref 36.0–46.0)
Hemoglobin: 14 g/dL (ref 12.0–15.0)
Immature Granulocytes: 0 %
Lymphocytes Relative: 13 %
Lymphs Abs: 1.2 10*3/uL (ref 0.7–4.0)
MCH: 30.6 pg (ref 26.0–34.0)
MCHC: 34.9 g/dL (ref 30.0–36.0)
MCV: 87.7 fL (ref 80.0–100.0)
Monocytes Absolute: 0.5 10*3/uL (ref 0.1–1.0)
Monocytes Relative: 5 %
Neutro Abs: 7.5 10*3/uL (ref 1.7–7.7)
Neutrophils Relative %: 81 %
Platelets: 292 10*3/uL (ref 150–400)
RBC: 4.57 MIL/uL (ref 3.87–5.11)
RDW: 12.1 % (ref 11.5–15.5)
WBC: 9.4 10*3/uL (ref 4.0–10.5)
nRBC: 0 % (ref 0.0–0.2)

## 2022-10-11 LAB — URINALYSIS, ROUTINE W REFLEX MICROSCOPIC
Bilirubin Urine: NEGATIVE
Glucose, UA: NEGATIVE mg/dL
Ketones, ur: NEGATIVE mg/dL
Leukocytes,Ua: NEGATIVE
Nitrite: NEGATIVE
Protein, ur: 100 mg/dL — AB
RBC / HPF: >50 RBC/hpf — ABNORMAL HIGH (ref 0–5)
Specific Gravity, Urine: 1.021 (ref 1.005–1.030)
pH: 5 (ref 5.0–8.0)

## 2022-10-11 LAB — COMPREHENSIVE METABOLIC PANEL
ALT: 32 U/L (ref 0–44)
AST: 24 U/L (ref 15–41)
Albumin: 4.4 g/dL (ref 3.5–5.0)
Alkaline Phosphatase: 62 U/L (ref 38–126)
Anion gap: 8 (ref 5–15)
BUN: 13 mg/dL (ref 6–20)
CO2: 23 mmol/L (ref 22–32)
Calcium: 9.2 mg/dL (ref 8.9–10.3)
Chloride: 110 mmol/L (ref 98–111)
Creatinine, Ser: 0.9 mg/dL (ref 0.44–1.00)
GFR, Estimated: >60 mL/min (ref >60)
Glucose, Bld: 131 mg/dL — ABNORMAL HIGH (ref 70–99)
Potassium: 3.6 mmol/L (ref 3.5–5.1)
Sodium: 141 mmol/L (ref 135–145)
Total Bilirubin: 0.9 mg/dL (ref 0.3–1.2)
Total Protein: 7.2 g/dL (ref 6.5–8.1)

## 2022-10-11 LAB — LIPASE, BLOOD: Lipase: 33 U/L (ref 11–51)

## 2022-10-11 MED ORDER — TAMSULOSIN HCL 0.4 MG PO CAPS: 0.4000 mg | ORAL_CAPSULE | Freq: Every day | ORAL | 0 refills | Status: AC

## 2022-10-11 MED ORDER — ONDANSETRON HCL 4 MG/2ML IJ SOLN
4.0000 mg | Freq: Once | INTRAMUSCULAR | Status: AC
  Administered 2022-10-11: 4 mg via INTRAVENOUS
  Filled 2022-10-11: qty 2

## 2022-10-11 MED ORDER — OXYCODONE HCL 5 MG PO TABS: 5.0000 mg | ORAL_TABLET | ORAL | 0 refills | Status: AC | PRN

## 2022-10-11 MED ORDER — ONDANSETRON HCL 4 MG PO TABS: 4.0000 mg | ORAL_TABLET | ORAL | 0 refills | Status: AC | PRN

## 2022-10-11 MED ORDER — TAMSULOSIN HCL 0.4 MG PO CAPS
0.4000 mg | ORAL_CAPSULE | Freq: Once | ORAL | Status: AC
  Administered 2022-10-11: 0.4 mg via ORAL
  Filled 2022-10-11: qty 1

## 2022-10-11 MED ORDER — KETOROLAC TROMETHAMINE 30 MG/ML IJ SOLN
30.0000 mg | Freq: Once | INTRAMUSCULAR | Status: AC
  Administered 2022-10-11: 30 mg via INTRAVENOUS
  Filled 2022-10-11: qty 1

## 2022-10-11 MED ORDER — OXYCODONE HCL 5 MG PO TABS
5.0000 mg | ORAL_TABLET | Freq: Once | ORAL | Status: AC
  Administered 2022-10-11: 5 mg via ORAL
  Filled 2022-10-11: qty 1

## 2022-10-11 MED ORDER — ACETAMINOPHEN 325 MG PO TABS: 650.0000 mg | ORAL_TABLET | Freq: Four times a day (QID) | ORAL | 0 refills | Status: AC | PRN

## 2022-10-11 MED ORDER — FAMOTIDINE IN NACL 20-0.9 MG/50ML-% IV SOLN
20.0000 mg | Freq: Once | INTRAVENOUS | Status: AC
  Administered 2022-10-11: 20 mg via INTRAVENOUS
  Filled 2022-10-11: qty 50

## 2022-10-11 MED ORDER — IOHEXOL 300 MG/ML  SOLN
100.0000 mL | Freq: Once | INTRAMUSCULAR | Status: AC | PRN
  Administered 2022-10-11: 100 mL via INTRAVENOUS

## 2022-10-11 MED ORDER — HYDROMORPHONE HCL 1 MG/ML IJ SOLN
1.0000 mg | Freq: Once | INTRAMUSCULAR | Status: AC
  Administered 2022-10-11: 1 mg via INTRAVENOUS
  Filled 2022-10-11: qty 1

## 2022-10-11 MED ORDER — SODIUM CHLORIDE 0.9 % IV BOLUS
1000.0000 mL | Freq: Once | INTRAVENOUS | Status: AC
  Administered 2022-10-11: 1000 mL via INTRAVENOUS

## 2022-10-11 NOTE — ED Notes (Signed)
ED Provider at bedside. 

## 2022-10-11 NOTE — ED Notes (Signed)
Patient transported to CT 

## 2022-10-11 NOTE — ED Provider Notes (Signed)
[facility] EMERGENCY DEPARTMENT Provider Note   CSN: [id number] Arrival date & time: 10/11/22  0327     History  Chief Complaint  Patient presents with   Emesis   Abdominal Pain    Kara Fowler is a 51 y.o. female.  Patient as above with significant medical history as below, including GERD, headache, peptic ulcer, kidney stones, appendectomy who presents to the ED with complaint of rlq abd pain, nausea, chills Pt reports sudden onset pain around midnight tonight Had similar episode 2 nights ago but pain subsided and she felt better after a brief period Has nausea and vomiting starting this evening No change to bowel/bladder activity Subjective fever/chills No suspicious po intake No rashes    Past Medical History:  Diagnosis Date   GERD (gastroesophageal reflux disease) 04/15/2016   Headache    sinus   History of kidney stones    Ovarian cyst    Peptic ulcer    PONV (postoperative nausea and vomiting)    Sinusitis    Tinnitus    Vertigo     Past Surgical History:  Procedure Laterality Date   APPENDECTOMY     BIOPSY  05/17/2016   Procedure: BIOPSY;  Surgeon: Rogene Houston, MD;  Location: [facility];  Service: Endoscopy;;  gastric, duodenum   bladder stretching  2017   dr Amalia Hailey   CYSTOSCOPY/URETEROSCOPY/HOLMIUM LASER/STENT PLACEMENT Bilateral 11/12/2017   Procedure: CYSTOSCOPY/URETEROSCOPY/HOLMIUM LASER/STENT PLACEMENT;  Surgeon: Nickie Retort, MD;  Location: [facility];  Service: Urology;  Laterality: Bilateral;   ESOPHAGOGASTRODUODENOSCOPY N/A 05/17/2016   Procedure: ESOPHAGOGASTRODUODENOSCOPY (EGD);  Surgeon: Rogene Houston, MD;  Location: [facility];  Service: Endoscopy;  Laterality: N/A;  2:00 - moved to 10:30 - office to notify   [place]     had stent placed     The history is provided by the patient. No language interpreter was used.  Emesis Associated symptoms: abdominal pain   Associated symptoms: no  chills, no cough, no fever and no headaches   Abdominal Pain Associated symptoms: nausea and vomiting   Associated symptoms: no chest pain, no chills, no cough, no fever, no hematuria and no shortness of breath        Home Medications Prior to Admission medications   Medication Sig Start Date End Date Taking? Authorizing Provider  acetaminophen (TYLENOL) 325 MG tablet Take 2 tablets (650 mg total) by mouth every 6 (six) hours as needed. 10/11/22  Yes Wynona Dove A, DO  ondansetron (ZOFRAN) 4 MG tablet Take 1 tablet (4 mg total) by mouth every 4 (four) hours as needed for nausea or vomiting. 10/11/22  Yes Wynona Dove A, DO  oxyCODONE (ROXICODONE) 5 MG immediate release tablet Take 1 tablet (5 mg total) by mouth every 4 (four) hours as needed for severe pain. 10/11/22  Yes Wynona Dove A, DO  tamsulosin (FLOMAX) 0.4 MG CAPS capsule Take 1 capsule (0.4 mg total) by mouth daily for 14 days. 10/11/22 10/25/22 Yes Jeanell Sparrow, DO  ALPRAZolam Duanne Moron) 0.25 MG tablet Take 0.5 mg by mouth as needed for anxiety. Usually takes at night.    [provider]  celecoxib (CELEBREX) 200 MG capsule Take 200 mg by mouth. Patient is to start today- 03/13/20: She will begin taking one by mouth for 5 days, then off 2 days.    [provider]  cetirizine (ZYRTEC) 10 MG tablet Take 10 mg by mouth daily.    [provider]  escitalopram (LEXAPRO) 5 MG tablet Take 5 mg by mouth daily.    [provider]  hyoscyamine (LEVSIN SL) 0.125 MG SL tablet Place 1 tablet (0.125 mg total) under the tongue every 6 (six) hours as needed (abd pain). 03/13/20   Ezzard Standing, PA-C  meclizine (ANTIVERT) 25 MG tablet Take 25 mg by mouth. 1 tablet day or every 6 hours.    [provider]  Multiple Vitamins-Minerals (CENTRUM ADULTS PO) Take by mouth daily.     [provider]  pantoprazole (PROTONIX) 40 MG tablet Take 1 tablet (40 mg total) by mouth daily. 04/12/21   Rehman, Mechele Dawley, MD  Vibegron (GEMTESA) 75 MG TABS Take 1 tablet by mouth daily.    [provider]  zolpidem (AMBIEN) 10 MG tablet Take 10 mg by mouth at bedtime as needed for sleep.    [provider]      Allergies    Ibuprofen and Milk-related compounds    Review of Systems   Review of Systems  Constitutional:  Negative for chills and fever.  HENT:  Negative for facial swelling and trouble swallowing.   Eyes:  Negative for photophobia and visual disturbance.  Respiratory:  Negative for cough and shortness of breath.   Cardiovascular:  Negative for chest pain and palpitations.  Gastrointestinal:  Positive for abdominal pain, nausea and vomiting.  Endocrine: Negative for polydipsia and polyuria.  Genitourinary:  Negative for difficulty urinating and hematuria.  Musculoskeletal:  Negative for gait problem and joint swelling.  Skin:  Negative for pallor and rash.  Neurological:  Negative for syncope and headaches.  Psychiatric/Behavioral:  Negative for agitation and confusion.     Physical Exam Updated Vital Signs BP (!) 124/90 (BP Location: Right Arm)   Pulse 76   Temp 97.7 F (36.5 C) (Oral)   Resp 18   Ht _0  (1.702 m)   Wt 77.1 kg   SpO2 97%   BMI 26.63 kg/m  Physical Exam Vitals and nursing note reviewed.  Constitutional:      General: She is not in acute distress.    Appearance: Normal appearance. She is diaphoretic.  HENT:     Head: Normocephalic and atraumatic.     Right Ear: External ear normal.     Left Ear: External ear normal.     Nose: Nose normal.     Mouth/Throat:     Mouth: Mucous membranes are moist.  Eyes:     General: No scleral icterus.       Right eye: No discharge.        Left eye: No discharge.  Cardiovascular:     Rate and Rhythm: Normal rate and regular rhythm.     Pulses: Normal pulses.     Heart sounds: Normal heart sounds.  Pulmonary:     Effort: Pulmonary effort is normal. No respiratory distress.     Breath sounds: Normal  breath sounds.  Abdominal:     General: Abdomen is flat.     Palpations: Abdomen is soft.     Tenderness: There is abdominal tenderness in the right lower quadrant. There is no guarding or rebound.    Musculoskeletal:        General: Normal range of motion.     Cervical back: Normal range of motion.     Right lower leg: No edema.     Left lower leg: No edema.  Skin:    General: Skin is warm.     Capillary  Refill: Capillary refill takes less than 2 seconds.  Neurological:     Mental Status: She is alert and oriented to person, place, and time.     GCS: GCS eye subscore is 4. GCS verbal subscore is 5. GCS motor subscore is 6.  Psychiatric:        Mood and Affect: Mood normal.        Behavior: Behavior normal.     ED Results / Procedures / Treatments   Labs (all labs ordered are listed, but only abnormal results are displayed) Labs Reviewed  COMPREHENSIVE METABOLIC PANEL - Abnormal; Notable for the following components:      Result Value   Glucose, Bld 131 (*)    All other components within normal limits  URINALYSIS, ROUTINE W REFLEX MICROSCOPIC - Abnormal; Notable for the following components:   APPearance HAZY (*)    Hgb urine dipstick LARGE (*)    Protein, ur 100 (*)    RBC / HPF >50 (*)    Bacteria, UA RARE (*)    All other components within normal limits  CBC WITH DIFFERENTIAL/PLATELET  LIPASE, BLOOD    EKG None  Radiology CT ABDOMEN PELVIS W CONTRAST  Result Date: 10/11/2022 CLINICAL DATA:  Right lower quadrant pain and nausea with vomiting beginning last night. Previous appendectomy. EXAM: CT ABDOMEN AND PELVIS WITH CONTRAST TECHNIQUE: Multidetector CT imaging of the abdomen and pelvis was performed using the standard protocol following bolus administration of intravenous contrast. RADIATION DOSE REDUCTION: This exam was performed according to the departmental dose-optimization program which includes automated exposure control, adjustment of the mA and/or kV  according to patient size and/or use of iterative reconstruction technique. CONTRAST:  167m OMNIPAQUE IOHEXOL 300 MG/ML  SOLN COMPARISON:  09/19/2017 FINDINGS: Lower Chest: No acute findings. Hepatobiliary: Stable sub-cm low-attenuation lesion in the posterior left hepatic is too small to characterize, but consistent with a benign etiology such as a cyst or hemangioma. No new or enlarging liver lesions identified. Gallbladder is unremarkable. No evidence of biliary ductal dilatation. Pancreas:  No mass or inflammatory changes. Spleen: Within normal limits in size and appearance. Adrenals/Urinary Tract: 2 mm right renal calculus is seen. Moderate right hydronephrosis present due to a 7 mm calculus at the right UPJ. Stomach/Bowel: Small hiatal hernia is seen. No evidence of obstruction, inflammatory process or abnormal fluid collections. Vascular/Lymphatic: No pathologically enlarged lymph nodes. No acute vascular findings. Reproductive: Prior hysterectomy. No mass or other significant abnormality. Other:  None. Musculoskeletal:  No suspicious bone lesions identified. IMPRESSION: Moderate right hydronephrosis due to 7 mm calculus at the right UPJ. Tiny right renal calculus. Small hiatal hernia. Electronically Signed   By: JMarlaine HindM.D.   On: 10/11/2022 05:21    Procedures Procedures    Medications Ordered in ED Medications  tamsulosin (FLOMAX) capsule 0.4 mg (has no administration in time range)  oxyCODONE (Oxy IR/ROXICODONE) immediate release tablet 5 mg (has no administration in time range)  ondansetron (ZOFRAN) injection 4 mg (4 mg Intravenous Given 10/11/22 0405)  ketorolac (TORADOL) 30 MG/ML injection 30 mg (30 mg Intravenous Given 10/11/22 0405)  famotidine (PEPCID) IVPB 20 mg premix (0 mg Intravenous Stopped 10/11/22 0438)  sodium chloride 0.9 % bolus 1,000 mL (0 mLs Intravenous Stopped 10/11/22 0551)  HYDROmorphone (DILAUDID) injection 1 mg (1 mg Intravenous Given 10/11/22 0450)   ondansetron (ZOFRAN) injection 4 mg (4 mg Intravenous Given 10/11/22 0450)  iohexol (OMNIPAQUE) 300 MG/ML solution 100 mL (100 mLs Intravenous Contrast Given 10/11/22 0457)  ED Course/ Medical Decision Making/ A&P                           Medical Decision Making Amount and/or Complexity of Data Reviewed Labs: ordered. Radiology: ordered.  Risk Prescription drug management.   This patient presents to the ED with chief complaint(s) of abd pain with pertinent past medical history of kidney stones, as above which further complicates the presenting complaint. The complaint involves an extensive differential diagnosis and also carries with it a high risk of complications and morbidity.     Differential diagnosis includes but is not exclusive to ectopic pregnancy, ovarian cyst, ovarian torsion, acute appendicitis, urinary tract infection, endometriosis, bowel obstruction, hernia, colitis, renal colic, gastroenteritis, volvulus etc.  . Serious etiologies were considered.   The initial plan is to screening labs/imaging/ivf/analgesia/re-assess   Additional history obtained: Additional history obtained from spouse Records reviewed Primary Care Documents and prior labs/imaging/home meds  Independent labs interpretation:  The following labs were independently interpreted:  Ua w/ blood concerning for stone, no infection Met panel with stable cr Cbc w/o leukocytosis  Independent visualization of imaging: - I independently visualized the following imaging with scope of interpretation limited to determining acute life threatening conditions related to emergency care: ctap, which revealed 6m obstructive stone at right upj, hiatal hernia  Cardiac monitoring was reviewed and interpreted by myself which shows nsr  Treatment and Reassessment: Dilaudid Zofran Pepcid Toradol IVF >> greatly improved   Consultation: - Consulted or discussed management/test interpretation w/ external  professional: na n Consideration for admission or further workup: Admission was considered   Pt here with abd pain, found to have 772mkidney stone, likely source of the pain. Moderate right hydro. No AKI, not septic. Pain well controlled at this time. Able to tolerate po. She has had kidney stone in the past. Sees urology back home (in town visiting for holidays) Will give analgesia for home, flomax, rehydration instructions, f/u urology for o/p mgmt, rted if worse  The patient improved significantly and was discharged in stable condition. Detailed discussions were had with the patient regarding current findings, and need for close f/u with PCP or on call doctor. The patient has been instructed to return immediately if the symptoms worsen in any way for re-evaluation. Patient verbalized understanding and is in agreement with current care plan. All questions answered prior to discharge.    Social Determinants of health: Social History   Tobacco Use   Smoking status: Never   Smokeless tobacco: Never  Vaping Use   Vaping Use: Never used  Substance Use Topics   Alcohol use: Yes    Comment: Occ   Drug use: No            Final Clinical Impression(s) / ED Diagnoses Final diagnoses:  Abdominal pain, unspecified abdominal location  Acute unilateral obstructive uropathy    Rx / DC Orders ED Discharge Orders          Ordered    oxyCODONE (ROXICODONE) 5 MG immediate release tablet  Every 4 hours PRN        10/11/22 0557    acetaminophen (TYLENOL) 325 MG tablet  Every 6 hours PRN        10/11/22 0557    tamsulosin (FLOMAX) 0.4 MG CAPS capsule  Daily        10/11/22 0557    ondansetron (ZOFRAN) 4 MG tablet  Every 4 hours PRN  10/11/22 0600              Jeanell Sparrow, DO 10/11/22 [phone]

## 2022-10-11 NOTE — Discharge Instructions (Addendum)
It was a pleasure caring for you today in the emergency department.  Please return to the emergency department for any worsening or worrisome symptoms.  Please follow up with your urologist in the next week  Please return to the ED if your pain becomes severe, worsening nausea/vomiting, develop a fever, or you have worsening/worrisome symptoms

## 2022-10-11 NOTE — ED Triage Notes (Addendum)
C/o started having abdominal pain RLQ, then feeling sick on stomach with emesis that started around 0000. Pain to touch RLQ. Pt shaking and sweating. Pt had an appendectomy

## 2022-10-13 ENCOUNTER — Other Ambulatory Visit: Payer: Self-pay

## 2022-10-13 ENCOUNTER — Encounter (HOSPITAL_COMMUNITY): Payer: Self-pay | Admitting: Emergency Medicine

## 2022-10-13 ENCOUNTER — Emergency Department (HOSPITAL_COMMUNITY)
Admission: EM | Admit: 2022-10-13 | Discharge: 2022-10-14 | Disposition: A | Discharge status: Home / Self Care | Payer: BC Managed Care – PPO | Attending: Emergency Medicine | Admitting: Emergency Medicine

## 2022-10-13 DIAGNOSIS — R944 Abnormal results of kidney function studies: Secondary | ICD-10-CM | POA: Diagnosis not present

## 2022-10-13 DIAGNOSIS — N201 Calculus of ureter: Secondary | ICD-10-CM | POA: Insufficient documentation

## 2022-10-13 DIAGNOSIS — R1031 Right lower quadrant pain: Secondary | ICD-10-CM | POA: Diagnosis present

## 2022-10-13 LAB — URINALYSIS, ROUTINE W REFLEX MICROSCOPIC
Bilirubin Urine: NEGATIVE
Glucose, UA: NEGATIVE mg/dL
Ketones, ur: 80 mg/dL — AB
Leukocytes,Ua: NEGATIVE
Nitrite: NEGATIVE
Protein, ur: NEGATIVE mg/dL
Specific Gravity, Urine: 1.016 (ref 1.005–1.030)
pH: 6 (ref 5.0–8.0)

## 2022-10-13 LAB — CBC WITH DIFFERENTIAL/PLATELET
Abs Immature Granulocytes: 0.01 10*3/uL (ref 0.00–0.07)
Basophils Absolute: 0 10*3/uL (ref 0.0–0.1)
Basophils Relative: 1 %
Eosinophils Absolute: 0.1 10*3/uL (ref 0.0–0.5)
Eosinophils Relative: 1 %
HCT: 39.4 % (ref 36.0–46.0)
Hemoglobin: 13.5 g/dL (ref 12.0–15.0)
Immature Granulocytes: 0 %
Lymphocytes Relative: 14 %
Lymphs Abs: 1.1 10*3/uL (ref 0.7–4.0)
MCH: 30.5 pg (ref 26.0–34.0)
MCHC: 34.3 g/dL (ref 30.0–36.0)
MCV: 88.9 fL (ref 80.0–100.0)
Monocytes Absolute: 0.6 10*3/uL (ref 0.1–1.0)
Monocytes Relative: 7 %
Neutro Abs: 6.2 10*3/uL (ref 1.7–7.7)
Neutrophils Relative %: 77 %
Platelets: 262 10*3/uL (ref 150–400)
RBC: 4.43 MIL/uL (ref 3.87–5.11)
RDW: 12 % (ref 11.5–15.5)
WBC: 8 10*3/uL (ref 4.0–10.5)
nRBC: 0 % (ref 0.0–0.2)

## 2022-10-13 LAB — COMPREHENSIVE METABOLIC PANEL
ALT: 27 U/L (ref 0–44)
AST: 18 U/L (ref 15–41)
Albumin: 4.1 g/dL (ref 3.5–5.0)
Alkaline Phosphatase: 63 U/L (ref 38–126)
Anion gap: 9 (ref 5–15)
BUN: 14 mg/dL (ref 6–20)
CO2: 25 mmol/L (ref 22–32)
Calcium: 8.9 mg/dL (ref 8.9–10.3)
Chloride: 108 mmol/L (ref 98–111)
Creatinine, Ser: 1.28 mg/dL — ABNORMAL HIGH (ref 0.44–1.00)
GFR, Estimated: 50 mL/min — ABNORMAL LOW (ref >60)
Glucose, Bld: 105 mg/dL — ABNORMAL HIGH (ref 70–99)
Potassium: 4 mmol/L (ref 3.5–5.1)
Sodium: 142 mmol/L (ref 135–145)
Total Bilirubin: 1 mg/dL (ref 0.3–1.2)
Total Protein: 6.9 g/dL (ref 6.5–8.1)

## 2022-10-13 MED ORDER — SODIUM CHLORIDE 0.9 % IV BOLUS
1000.0000 mL | Freq: Once | INTRAVENOUS | Status: AC
  Administered 2022-10-13: 1000 mL via INTRAVENOUS

## 2022-10-13 MED ORDER — ONDANSETRON HCL 4 MG/2ML IJ SOLN: 4.0000 mg | Freq: Once | INTRAMUSCULAR | Status: DC

## 2022-10-13 MED ORDER — HYDROMORPHONE HCL 1 MG/ML IJ SOLN
1.0000 mg | Freq: Once | INTRAMUSCULAR | Status: AC
  Administered 2022-10-13: 1 mg via INTRAVENOUS
  Filled 2022-10-13: qty 1

## 2022-10-13 MED ORDER — PROMETHAZINE HCL 25 MG/ML IJ SOLN
INTRAMUSCULAR | Status: AC
  Filled 2022-10-13: qty 1

## 2022-10-13 MED ORDER — KETOROLAC TROMETHAMINE 30 MG/ML IJ SOLN
30.0000 mg | Freq: Once | INTRAMUSCULAR | Status: AC
  Administered 2022-10-13: 30 mg via INTRAVENOUS
  Filled 2022-10-13: qty 1

## 2022-10-13 MED ORDER — SODIUM CHLORIDE 0.9 % IV SOLN
12.5000 mg | Freq: Once | INTRAVENOUS | Status: AC
  Administered 2022-10-13: 12.5 mg via INTRAVENOUS
  Filled 2022-10-13: qty 0.5

## 2022-10-13 MED ORDER — TAMSULOSIN HCL 0.4 MG PO CAPS
0.4000 mg | ORAL_CAPSULE | Freq: Once | ORAL | Status: AC
  Administered 2022-10-13: 0.4 mg via ORAL
  Filled 2022-10-13: qty 1

## 2022-10-13 NOTE — ED Notes (Signed)
[facility] notified for phenergan IVPB

## 2022-10-13 NOTE — ED Provider Notes (Signed)
[facility] EMERGENCY DEPARTMENT Provider Note   CSN: [id number] Arrival date & time: 10/13/22  1723     History  Chief Complaint  Patient presents with   Flank Pain    Kara Fowler is a 51 y.o. female presents to the ED complaining of right flank pain that radiates into her lower right abdomen.  Patient was seen here in ED on 10/11/2022 and was diagnosed with a 7 mm right side stone at the UPJ.  Patient states the pain and vomiting is getting worse despite taking her prescribed Zofran and oxycodone.  She returns today due to poor pain control at home.  She is still urinating without difficulty and denies hematuria.  Denies fever, chills, dysuria, or diarrhea.        Home Medications Prior to Admission medications   Medication Sig Start Date End Date Taking? Authorizing Provider  acetaminophen (TYLENOL) 325 MG tablet Take 2 tablets (650 mg total) by mouth every 6 (six) hours as needed. 10/11/22   Sloan Leiter, DO  ALPRAZolam Prudy Feeler) 0.25 MG tablet Take 0.5 mg by mouth as needed for anxiety. Usually takes at night.    [provider]  celecoxib (CELEBREX) 200 MG capsule Take 200 mg by mouth. Patient is to start today- 03/13/20: She will begin taking one by mouth for 5 days, then off 2 days.    [provider]  cetirizine (ZYRTEC) 10 MG tablet Take 10 mg by mouth daily.    [provider]  escitalopram (LEXAPRO) 5 MG tablet Take 5 mg by mouth daily.    [provider]  hyoscyamine (LEVSIN SL) 0.125 MG SL tablet Place 1 tablet (0.125 mg total) under the tongue every 6 (six) hours as needed (abd pain). 03/13/20   Ardelia Mems, PA-C  meclizine (ANTIVERT) 25 MG tablet Take 25 mg by mouth. 1 tablet day or every 6 hours.    [provider]  Multiple Vitamins-Minerals (CENTRUM ADULTS PO) Take by mouth daily.     [provider]  ondansetron (ZOFRAN) 4 MG tablet Take 1 tablet (4 mg total) by mouth every 4 (four) hours as needed  for nausea or vomiting. 10/11/22   Tanda Rockers A, DO  oxyCODONE (ROXICODONE) 5 MG immediate release tablet Take 1 tablet (5 mg total) by mouth every 4 (four) hours as needed for severe pain. 10/11/22   Sloan Leiter, DO  pantoprazole (PROTONIX) 40 MG tablet Take 1 tablet (40 mg total) by mouth daily. 04/12/21   Malissa Hippo, MD  tamsulosin (FLOMAX) 0.4 MG CAPS capsule Take 1 capsule (0.4 mg total) by mouth daily for 14 days. 10/11/22 10/25/22  Sloan Leiter, DO  Vibegron (GEMTESA) 75 MG TABS Take 1 tablet by mouth daily.    [provider]  zolpidem (AMBIEN) 10 MG tablet Take 10 mg by mouth at bedtime as needed for sleep.    [provider]      Allergies    Ibuprofen and Milk-related compounds    Review of Systems   Review of Systems  Constitutional:  Negative for chills and fever.  Gastrointestinal:  Positive for abdominal pain, nausea and vomiting. Negative for diarrhea.  Genitourinary:  Positive for flank pain and frequency. Negative for difficulty urinating, dysuria and hematuria.    Physical Exam Updated Vital Signs BP (!) 153/89   Pulse 84   Temp 98.6 F (37 C) (Oral)   Resp 14   Ht 5\' 7"  (1.702 m)   Wt  77.1 kg   LMP 08/11/2017   SpO2 98%   BMI 26.63 kg/m  Physical Exam Vitals and nursing note reviewed.  Constitutional:      General: She is not in acute distress.    Appearance: She is not ill-appearing.     Comments: Patient appears very uncomfortable and is moving about the bed trying to find a comfortable position  HENT:     Mouth/Throat:     Mouth: Mucous membranes are moist.     Pharynx: Oropharynx is clear.  Cardiovascular:     Rate and Rhythm: Normal rate and regular rhythm.     Pulses: Normal pulses.     Heart sounds: Normal heart sounds.  Pulmonary:     Effort: Pulmonary effort is normal. No respiratory distress.     Breath sounds: Normal breath sounds and air entry.  Abdominal:     General: Abdomen is flat. Bowel sounds are  normal. There is no distension.     Palpations: Abdomen is soft.     Tenderness: There is abdominal tenderness in the right lower quadrant and suprapubic area. There is right CVA tenderness.     Comments: Right CVA tenderness, right flank tenderness and right lower quadrant and suprapubic tenderness to palpation.  Patient reports there is now more tenderness in the abdomen and flank versus her back.  Skin:    General: Skin is warm and dry.     Capillary Refill: Capillary refill takes less than 2 seconds.  Neurological:     Mental Status: She is alert. Mental status is at baseline.  Psychiatric:        Mood and Affect: Mood normal.        Behavior: Behavior normal.     ED Results / Procedures / Treatments   Labs (all labs ordered are listed, but only abnormal results are displayed) Labs Reviewed  URINALYSIS, ROUTINE W REFLEX MICROSCOPIC - Abnormal; Notable for the following components:      Result Value   APPearance HAZY (*)    Hgb urine dipstick SMALL (*)    Ketones, ur 80 (*)    Bacteria, UA RARE (*)    All other components within normal limits  COMPREHENSIVE METABOLIC PANEL - Abnormal; Notable for the following components:   Glucose, Bld 105 (*)    Creatinine, Ser 1.28 (*)    GFR, Estimated 50 (*)    All other components within normal limits  CBC WITH DIFFERENTIAL/PLATELET    EKG None  Radiology No results found.  Procedures Procedures    Medications Ordered in ED Medications  ketorolac (TORADOL) 30 MG/ML injection 30 mg (has no administration in time range)  sodium chloride 0.9 % bolus 1,000 mL (has no administration in time range)  tamsulosin (FLOMAX) capsule 0.4 mg (has no administration in time range)  sodium chloride 0.9 % bolus 1,000 mL (0 mLs Intravenous Stopped 10/13/22 2258)  HYDROmorphone (DILAUDID) injection 1 mg (1 mg Intravenous Given 10/13/22 2204)  promethazine (PHENERGAN) 12.5 mg in sodium chloride 0.9 % 50 mL IVPB (0 mg Intravenous Stopped  10/13/22 2258)    ED Course/ Medical Decision Making/ A&P                           Medical Decision Making Amount and/or Complexity of Data Reviewed Labs: ordered.  Risk Prescription drug management.   This patient presents to the ED with chief complaint(s) of worsening flank pain with nausea and vomiting with pertinent  past medical history of diagnosed 7mm stone at right UPJ .The complaint involves an extensive differential diagnosis and also carries with it a high risk of complications and morbidity.    The differential diagnosis includes acute unilateral obstructive uropathy   The initial plan is to repeat baseline labs and UA, give IV fluids and medication for nausea and pain  Additional history obtained: Additional history obtained from spouse Records reviewed Care Everywhere/External Records  Initial Assessment:   On exam, patient appears to be uncomfortable and in pain, she is non-toxic and not diaphoretic.  Skin is warm and dry.  Lungs clear to auscultation.  Heart rate is normal with regular rhythm.  Tenderness to palpation of RLQ and suprapubic regions as well as R flank and R CVA.  Abdomen is soft and non distended.    Independent ECG/labs interpretation:  The following labs were independently interpreted:  CBC reveals no evidence of leukocytosis or anemia.  Metabolic panel significant for elevated creatinine at 1.28, this is an increase from 2 days prior which was 0.90.  Patient does not have history of CKD, Cr have typically been within normal range.  UA reveals no pyuria, evidence of UTI or suspicion of pyelonephritis; there is small amount of Hgb and ketones with rare bacteria.    Independent visualization and interpretation of imaging: I independently visualized the following imaging with scope of interpretation limited to determining acute life threatening conditions related to emergency care: Not indicated  Treatment and Reassessment: Will treat patient with  dilaudid, phenergan and IV fluids to determine if there is improvement.  Upon reassessment, patient states her nausea has improved, however, the pain has minimally improved and she continues to rate it as an 8/10.  Will request consult with on-call urology for recommendations.   Consultations obtained:   I requested consultation with on-call urology provider, spoke with Dr. Sherron Monday, transfer to [facility] for pain control and he will see patient in the morning if she continues to have severe pain and will consider possible stent placement.   Disposition:   11:21 PM Care transferred to and Dr. Pilar Plate at the end of my shift as the patient will require reassessment once labs/imaging have resulted. Patient presentation, ED course, and plan of care discussed with review of all pertinent labs and imaging. Please see his/her note for further details regarding further ED course and disposition. Plan at time of handoff is see how patient responds to additional fluid bolus, toradol and flomax.  If patient still has poor pain control, urology recommends transfer to [facility] for admission and she will be evaluated by urology in the morning if symptoms persist. This may be altered or completely changed at the discretion of the oncoming team pending results of further workup.           Final Clinical Impression(s) / ED Diagnoses Final diagnoses:  Obstruction of right ureteropelvic junction (UPJ) due to stone    Rx / DC Orders ED Discharge Orders     None         Lenard Simmer, [place] 10/13/22 2325    Pricilla Loveless, MD 10/14/22 [phone]

## 2022-10-13 NOTE — ED Triage Notes (Addendum)
Patient c/o right flank pain that radiates into abd. Per patient seen here in ED and diagnosed with 9mm kidney stone. Patient states pain and vomiting is getting worse despite taking prescribed medication (Zofran/oxycodone). Denies any fevers.

## 2022-10-14 MED ORDER — KETOROLAC TROMETHAMINE 10 MG PO TABS: 10.0000 mg | ORAL_TABLET | Freq: Four times a day (QID) | ORAL | 0 refills | Status: AC | PRN

## 2022-10-14 MED ORDER — HYDROCODONE-ACETAMINOPHEN 5-325 MG PO TABS: 2.0000 | ORAL_TABLET | ORAL | 0 refills | Status: AC | PRN

## 2022-10-14 MED ORDER — DIPHENHYDRAMINE HCL 50 MG/ML IJ SOLN
25.0000 mg | Freq: Once | INTRAMUSCULAR | Status: AC
  Administered 2022-10-14: 25 mg via INTRAVENOUS
  Filled 2022-10-14: qty 1

## 2022-10-14 MED ORDER — FENTANYL CITRATE PF 50 MCG/ML IJ SOSY
50.0000 ug | PREFILLED_SYRINGE | Freq: Once | INTRAMUSCULAR | Status: AC
  Administered 2022-10-14: 50 ug via INTRAVENOUS
  Filled 2022-10-14: qty 1

## 2022-10-14 NOTE — ED Notes (Signed)
Patient complaining of itching on arms and head. ED provider notified.

## 2022-10-14 NOTE — Discharge Instructions (Addendum)
You were evaluated in the Emergency Department and after careful evaluation, we did not find any emergent condition requiring admission or further testing in the hospital.  Your exam/testing today was overall reassuring.  Pain seems to be due to the kidney stone.  Continue taking the Flomax daily to help the stone pass.  Follow-up closely with urology.  Use the Vicodin or oxycodone as needed for significant pain.  Recommend using the ketorolac every 6 hours for pain as well.  Please return to the Emergency Department if you experience any worsening of your condition.  Thank you for allowing [place] to be a part of your care.

## 2022-10-14 NOTE — ED Provider Notes (Signed)
  Provider Note MRN:  [id number]  Arrival date & time: 10/14/22    ED Course and Medical Decision Making  Assumed care from Dr. Criss Alvine at shift change.  7 mm kidney stone, continued flank pain, no fever, no other complicating features, case discussed with urology given the continued pain.  Would consider admission to [facility] long if unable to control pain.  2:30 AM update: After more aggressive pain control methods pain is improved, she now feels comfortable going home with prescriptions, she will follow-up with her personal urologist.  Procedures  Final Clinical Impressions(s) / ED Diagnoses     ICD-10-CM   1. Obstruction of right ureteropelvic junction (UPJ) due to stone  N20.1       ED Discharge Orders          Ordered    HYDROcodone-acetaminophen (NORCO/VICODIN) 5-325 MG tablet  Every 4 hours PRN        10/14/22 0238    ketorolac (TORADOL) 10 MG tablet  Every 6 hours PRN        10/14/22 0238              Discharge Instructions      You were evaluated in the Emergency Department and after careful evaluation, we did not find any emergent condition requiring admission or further testing in the hospital.  Your exam/testing today was overall reassuring.  Pain seems to be due to the kidney stone.  Continue taking the Flomax daily to help the stone pass.  Follow-up closely with urology.  Use the Vicodin or oxycodone as needed for significant pain.  Recommend using the ketorolac every 6 hours for pain as well.  Please return to the Emergency Department if you experience any worsening of your condition.  Thank you for allowing [place] to be a part of your care.     Elmer Sow. Pilar Plate, MD [facility] Emergency Medicine [facility] [email or web address]    Sabas Sous, MD 10/14/22 [phone]

## 2022-10-15 MED FILL — Hydrocodone-Acetaminophen Tab 5-325 MG: ORAL | Qty: 6 | Status: AC
